# Patient Record
Sex: Female | Born: 1970 | Race: Black or African American | Hispanic: No | Marital: Single
Health system: Southern US, Community
[De-identification: ages and names within clinical notes are randomized; demographics above are authoritative.]

## PROBLEM LIST (undated history)

## (undated) DIAGNOSIS — I639 Cerebral infarction, unspecified: Secondary | ICD-10-CM

---

## 1999-10-25 ENCOUNTER — Encounter: Payer: Self-pay | Admitting: *Deleted

## 1999-10-25 ENCOUNTER — Encounter: Payer: Self-pay | Admitting: Surgery

## 1999-10-25 ENCOUNTER — Inpatient Hospital Stay (HOSPITAL_COMMUNITY): Admission: EM | Admit: 1999-10-25 | Discharge: 1999-10-27 | Payer: Self-pay

## 2001-06-02 ENCOUNTER — Emergency Department (HOSPITAL_COMMUNITY): Admission: EM | Admit: 2001-06-02 | Discharge: 2001-06-02 | Payer: Self-pay | Admitting: Emergency Medicine

## 2005-02-23 ENCOUNTER — Emergency Department (HOSPITAL_COMMUNITY): Admission: EM | Admit: 2005-02-23 | Discharge: 2005-02-23 | Payer: Self-pay | Admitting: Family Medicine

## 2005-03-17 ENCOUNTER — Emergency Department (HOSPITAL_COMMUNITY): Admission: EM | Admit: 2005-03-17 | Discharge: 2005-03-17 | Payer: Self-pay | Admitting: Family Medicine

## 2005-06-03 ENCOUNTER — Emergency Department (HOSPITAL_COMMUNITY): Admission: EM | Admit: 2005-06-03 | Discharge: 2005-06-03 | Payer: Self-pay | Admitting: Emergency Medicine

## 2006-06-22 ENCOUNTER — Emergency Department (HOSPITAL_COMMUNITY): Admission: EM | Admit: 2006-06-22 | Discharge: 2006-06-22 | Payer: Self-pay | Admitting: Family Medicine

## 2007-01-03 ENCOUNTER — Emergency Department (HOSPITAL_COMMUNITY): Admission: EM | Admit: 2007-01-03 | Discharge: 2007-01-03 | Payer: Self-pay | Admitting: Family Medicine

## 2011-06-01 ENCOUNTER — Emergency Department (HOSPITAL_COMMUNITY): Payer: Self-pay

## 2011-06-01 ENCOUNTER — Inpatient Hospital Stay (HOSPITAL_COMMUNITY)
Admission: EM | Admit: 2011-06-01 | Discharge: 2011-06-19 | DRG: 870 | Disposition: A | Discharge status: Home Health | Payer: Self-pay | Attending: Internal Medicine | Admitting: Internal Medicine

## 2011-06-01 DIAGNOSIS — N186 End stage renal disease: Secondary | ICD-10-CM | POA: Diagnosis present

## 2011-06-01 DIAGNOSIS — G92 Toxic encephalopathy: Secondary | ICD-10-CM | POA: Diagnosis present

## 2011-06-01 DIAGNOSIS — K661 Hemoperitoneum: Secondary | ICD-10-CM | POA: Diagnosis present

## 2011-06-01 DIAGNOSIS — S36899A Unspecified injury of other intra-abdominal organs, initial encounter: Secondary | ICD-10-CM | POA: Diagnosis present

## 2011-06-01 DIAGNOSIS — R0682 Tachypnea, not elsewhere classified: Secondary | ICD-10-CM | POA: Diagnosis present

## 2011-06-01 DIAGNOSIS — R7402 Elevation of levels of lactic acid dehydrogenase (LDH): Secondary | ICD-10-CM | POA: Diagnosis present

## 2011-06-01 DIAGNOSIS — F17200 Nicotine dependence, unspecified, uncomplicated: Secondary | ICD-10-CM | POA: Diagnosis present

## 2011-06-01 DIAGNOSIS — G929 Unspecified toxic encephalopathy: Secondary | ICD-10-CM | POA: Diagnosis present

## 2011-06-01 DIAGNOSIS — N179 Acute kidney failure, unspecified: Secondary | ICD-10-CM | POA: Diagnosis present

## 2011-06-01 DIAGNOSIS — E872 Acidosis, unspecified: Secondary | ICD-10-CM | POA: Diagnosis present

## 2011-06-01 DIAGNOSIS — A419 Sepsis, unspecified organism: Principal | ICD-10-CM | POA: Diagnosis present

## 2011-06-01 DIAGNOSIS — E876 Hypokalemia: Secondary | ICD-10-CM | POA: Diagnosis present

## 2011-06-01 DIAGNOSIS — R7401 Elevation of levels of liver transaminase levels: Secondary | ICD-10-CM | POA: Diagnosis present

## 2011-06-01 DIAGNOSIS — E871 Hypo-osmolality and hyponatremia: Secondary | ICD-10-CM | POA: Diagnosis present

## 2011-06-01 DIAGNOSIS — D62 Acute posthemorrhagic anemia: Secondary | ICD-10-CM | POA: Diagnosis present

## 2011-06-01 DIAGNOSIS — E440 Moderate protein-calorie malnutrition: Secondary | ICD-10-CM | POA: Diagnosis present

## 2011-06-01 DIAGNOSIS — K859 Acute pancreatitis without necrosis or infection, unspecified: Secondary | ICD-10-CM | POA: Diagnosis present

## 2011-06-01 DIAGNOSIS — A481 Legionnaires' disease: Secondary | ICD-10-CM | POA: Diagnosis present

## 2011-06-01 DIAGNOSIS — J9600 Acute respiratory failure, unspecified whether with hypoxia or hypercapnia: Secondary | ICD-10-CM | POA: Diagnosis not present

## 2011-06-01 DIAGNOSIS — R197 Diarrhea, unspecified: Secondary | ICD-10-CM | POA: Diagnosis present

## 2011-06-01 DIAGNOSIS — D631 Anemia in chronic kidney disease: Secondary | ICD-10-CM | POA: Diagnosis present

## 2011-06-01 LAB — HEPATIC FUNCTION PANEL
Bilirubin, Direct: 0.2 mg/dL (ref 0.0–0.3)
Indirect Bilirubin: 0.6 mg/dL (ref 0.3–0.9)

## 2011-06-01 LAB — AMMONIA: Ammonia: 23 umol/L (ref 11–60)

## 2011-06-01 LAB — PROCALCITONIN: Procalcitonin: 11.01 ng/mL

## 2011-06-01 LAB — DIFFERENTIAL
Basophils Absolute: 0 10*3/uL (ref 0.0–0.1)
Lymphocytes Relative: 5 % — ABNORMAL LOW (ref 12–46)
Lymphs Abs: 0.6 10*3/uL — ABNORMAL LOW (ref 0.7–4.0)
Monocytes Relative: 3 % (ref 3–12)
Neutrophils Relative %: 92 % — ABNORMAL HIGH (ref 43–77)

## 2011-06-01 LAB — CBC
Hemoglobin: 14.3 g/dL (ref 12.0–15.0)
MCHC: 38.5 g/dL — ABNORMAL HIGH (ref 30.0–36.0)
RBC: 3.95 MIL/uL (ref 3.87–5.11)
WBC: 12.7 10*3/uL — ABNORMAL HIGH (ref 4.0–10.5)

## 2011-06-01 LAB — PROTIME-INR: Prothrombin Time: 14.1 seconds (ref 11.6–15.2)

## 2011-06-01 LAB — LACTIC ACID, PLASMA: Lactic Acid, Venous: 1.3 mmol/L (ref 0.5–2.2)

## 2011-06-02 ENCOUNTER — Inpatient Hospital Stay (HOSPITAL_COMMUNITY): Payer: Self-pay

## 2011-06-02 ENCOUNTER — Other Ambulatory Visit (HOSPITAL_COMMUNITY): Payer: Self-pay

## 2011-06-02 ENCOUNTER — Inpatient Hospital Stay (HOSPITAL_COMMUNITY): Payer: Self-pay | Attending: Internal Medicine

## 2011-06-02 DIAGNOSIS — N179 Acute kidney failure, unspecified: Secondary | ICD-10-CM

## 2011-06-02 DIAGNOSIS — J9600 Acute respiratory failure, unspecified whether with hypoxia or hypercapnia: Secondary | ICD-10-CM

## 2011-06-02 DIAGNOSIS — A419 Sepsis, unspecified organism: Secondary | ICD-10-CM

## 2011-06-02 DIAGNOSIS — J189 Pneumonia, unspecified organism: Secondary | ICD-10-CM

## 2011-06-02 DIAGNOSIS — R0602 Shortness of breath: Secondary | ICD-10-CM

## 2011-06-02 LAB — RENAL FUNCTION PANEL
Albumin: 1.8 g/dL — ABNORMAL LOW (ref 3.5–5.2)
GFR calc Af Amer: 7 mL/min — ABNORMAL LOW (ref >60)
GFR calc non Af Amer: 6 mL/min — ABNORMAL LOW (ref >60)
Phosphorus: 6.8 mg/dL — ABNORMAL HIGH (ref 2.3–4.6)
Potassium: 3.7 mEq/L (ref 3.5–5.1)
Sodium: 130 mEq/L — ABNORMAL LOW (ref 135–145)

## 2011-06-02 LAB — BLOOD GAS, ARTERIAL
Acid-base deficit: 9.4 mmol/L — ABNORMAL HIGH (ref 0.0–2.0)
Bicarbonate: 15.3 mEq/L — ABNORMAL LOW (ref 20.0–24.0)
MECHVT: 440 mL
TCO2: 16.2 mmol/L (ref 0–100)
TCO2: 19.1 mmol/L (ref 0–100)
pCO2 arterial: 32.6 mmHg — ABNORMAL LOW (ref 35.0–45.0)
pCO2 arterial: 36.3 mmHg (ref 35.0–45.0)
pH, Arterial: 7.305 — ABNORMAL LOW (ref 7.350–7.400)
pH, Arterial: 7.332 — ABNORMAL LOW (ref 7.350–7.400)
pO2, Arterial: 107 mmHg — ABNORMAL HIGH (ref 80.0–100.0)

## 2011-06-02 LAB — POCT I-STAT 3, ART BLOOD GAS (G3+)
Acid-base deficit: 1 mmol/L (ref 0.0–2.0)
Acid-base deficit: 11 mmol/L — ABNORMAL HIGH (ref 0.0–2.0)
Acid-base deficit: 10 mmol/L — ABNORMAL HIGH (ref 0.0–2.0)
Bicarbonate: 19.8 mEq/L — ABNORMAL LOW (ref 20.0–24.0)
Bicarbonate: 17.3 mEq/L — ABNORMAL LOW (ref 20.0–24.0)
Bicarbonate: 15.3 mEq/L — ABNORMAL LOW (ref 20.0–24.0)
Bicarbonate: 17.9 mEq/L — ABNORMAL LOW (ref 20.0–24.0)
O2 Saturation: 100 %
O2 Saturation: 94 %
Patient temperature: 37
Patient temperature: 98.6
Patient temperature: 98.7
TCO2: 20 mmol/L (ref 0–100)
TCO2: 16 mmol/L (ref 0–100)
TCO2: 19 mmol/L (ref 0–100)
pCO2 arterial: 24.4 mmHg — ABNORMAL LOW (ref 35.0–45.0)
pCO2 arterial: 33.7 mmHg — ABNORMAL LOW (ref 35.0–45.0)
pCO2 arterial: 31.8 mmHg — ABNORMAL LOW (ref 35.0–45.0)
pH, Arterial: 7.517 — ABNORMAL HIGH (ref 7.350–7.400)
pH, Arterial: 7.412 — ABNORMAL HIGH (ref 7.350–7.400)
pH, Arterial: 7.254 — ABNORMAL LOW (ref 7.350–7.400)
pH, Arterial: 7.288 — ABNORMAL LOW (ref 7.350–7.400)
pO2, Arterial: 396 mmHg — ABNORMAL HIGH (ref 80.0–100.0)
pO2, Arterial: 381 mmHg — ABNORMAL HIGH (ref 80.0–100.0)
pO2, Arterial: 136 mmHg — ABNORMAL HIGH (ref 80.0–100.0)

## 2011-06-02 LAB — COMPREHENSIVE METABOLIC PANEL
AST: 220 U/L — ABNORMAL HIGH (ref 0–37)
Albumin: 1.9 g/dL — ABNORMAL LOW (ref 3.5–5.2)
Alkaline Phosphatase: 107 U/L (ref 39–117)
CO2: 16 mEq/L — ABNORMAL LOW (ref 19–32)
Chloride: 101 mEq/L (ref 96–112)
GFR calc non Af Amer: 6 mL/min — ABNORMAL LOW (ref >60)
Potassium: 3.9 mEq/L (ref 3.5–5.1)
Total Bilirubin: 0.9 mg/dL (ref 0.3–1.2)

## 2011-06-02 LAB — URINALYSIS, ROUTINE W REFLEX MICROSCOPIC
Glucose, UA: 100 mg/dL — AB
Leukocytes, UA: NEGATIVE
Nitrite: NEGATIVE
Specific Gravity, Urine: 1.022 (ref 1.005–1.030)
pH: 5.5 (ref 5.0–8.0)

## 2011-06-02 LAB — URINE MICROSCOPIC-ADD ON

## 2011-06-02 LAB — CARBOXYHEMOGLOBIN
Carboxyhemoglobin: 0.7 % (ref 0.5–1.5)
O2 Saturation: 91.5 %

## 2011-06-02 LAB — DIFFERENTIAL
Basophils Absolute: 0 10*3/uL (ref 0.0–0.1)
Lymphocytes Relative: 8 % — ABNORMAL LOW (ref 12–46)
Neutro Abs: 7.8 10*3/uL — ABNORMAL HIGH (ref 1.7–7.7)
Neutrophils Relative %: 88 % — ABNORMAL HIGH (ref 43–77)

## 2011-06-02 LAB — D-DIMER, QUANTITATIVE: D-Dimer, Quant: 12.64 ug/mL-FEU — ABNORMAL HIGH (ref 0.00–0.48)

## 2011-06-02 LAB — BASIC METABOLIC PANEL
BUN: 40 mg/dL — ABNORMAL HIGH (ref 6–23)
Chloride: 94 mEq/L — ABNORMAL LOW (ref 96–112)
Chloride: 97 mEq/L (ref 96–112)
GFR calc Af Amer: 9 mL/min — ABNORMAL LOW (ref >60)
GFR calc Af Amer: 9 mL/min — ABNORMAL LOW (ref >60)
Glucose, Bld: 90 mg/dL (ref 70–99)
Potassium: 2.6 mEq/L — CL (ref 3.5–5.1)
Potassium: 2.9 mEq/L — ABNORMAL LOW (ref 3.5–5.1)

## 2011-06-02 LAB — ABO/RH: ABO/RH(D): O POS

## 2011-06-02 LAB — CREATININE, URINE, RANDOM: Creatinine, Urine: 173.06 mg/dL

## 2011-06-02 LAB — CARDIAC PANEL(CRET KIN+CKTOT+MB+TROPI)
Relative Index: 0.5 (ref 0.0–2.5)
Troponin I: <0.3 ng/mL (ref <0.30)

## 2011-06-02 LAB — PHOSPHORUS: Phosphorus: 5.9 mg/dL — ABNORMAL HIGH (ref 2.3–4.6)

## 2011-06-02 LAB — GLUCOSE, CAPILLARY: Glucose-Capillary: 131 mg/dL — ABNORMAL HIGH (ref 70–99)

## 2011-06-02 LAB — POCT ACTIVATED CLOTTING TIME
Activated Clotting Time: 144 seconds
Activated Clotting Time: 149 seconds

## 2011-06-02 LAB — POCT I-STAT, CHEM 8
Calcium, Ion: 0.91 mmol/L — ABNORMAL LOW (ref 1.12–1.32)
Glucose, Bld: 138 mg/dL — ABNORMAL HIGH (ref 70–99)
HCT: 43 % (ref 36.0–46.0)
Hemoglobin: 14.6 g/dL (ref 12.0–15.0)
TCO2: 18 mmol/L (ref 0–100)

## 2011-06-02 LAB — PROTIME-INR
INR: 1.18 (ref 0.00–1.49)
Prothrombin Time: 15.3 seconds — ABNORMAL HIGH (ref 11.6–15.2)

## 2011-06-02 LAB — CBC
HCT: 31.5 % — ABNORMAL LOW (ref 36.0–46.0)
Hemoglobin: 11.7 g/dL — ABNORMAL LOW (ref 12.0–15.0)
WBC: 8.9 10*3/uL (ref 4.0–10.5)

## 2011-06-02 LAB — LACTIC ACID, PLASMA: Lactic Acid, Venous: 0.9 mmol/L (ref 0.5–2.2)

## 2011-06-02 LAB — STREP PNEUMONIAE URINARY ANTIGEN: Strep Pneumo Urinary Antigen: NEGATIVE

## 2011-06-02 LAB — CORTISOL: Cortisol, Plasma: 30.6 ug/dL

## 2011-06-02 LAB — APTT: aPTT: 37 seconds (ref 24–37)

## 2011-06-02 LAB — TYPE AND SCREEN

## 2011-06-02 LAB — PRO B NATRIURETIC PEPTIDE: Pro B Natriuretic peptide (BNP): 1501 pg/mL — ABNORMAL HIGH (ref 0–125)

## 2011-06-02 LAB — MRSA PCR SCREENING: MRSA by PCR: NEGATIVE

## 2011-06-03 ENCOUNTER — Inpatient Hospital Stay (HOSPITAL_COMMUNITY): Payer: Self-pay

## 2011-06-03 LAB — BLOOD GAS, ARTERIAL
Acid-base deficit: 2 mmol/L (ref 0.0–2.0)
Bicarbonate: 23.8 mEq/L (ref 20.0–24.0)
FIO2: 0.5 %
MECHVT: 440 mL
O2 Saturation: 99.4 %
PEEP: 5 cmH2O
Patient temperature: 98.4
Patient temperature: 98.6
RATE: 20 resp/min
TCO2: 25.7 mmol/L (ref 0–100)
pCO2 arterial: 39.9 mmHg (ref 35.0–45.0)
pH, Arterial: 7.206 — ABNORMAL LOW (ref 7.350–7.400)
pO2, Arterial: 59.8 mmHg — ABNORMAL LOW (ref 80.0–100.0)

## 2011-06-03 LAB — POCT I-STAT 3, ART BLOOD GAS (G3+)
Acid-base deficit: 2 mmol/L (ref 0.0–2.0)
Bicarbonate: 25.1 mEq/L — ABNORMAL HIGH (ref 20.0–24.0)
pCO2 arterial: 54.8 mmHg — ABNORMAL HIGH (ref 35.0–45.0)
pO2, Arterial: 102 mmHg — ABNORMAL HIGH (ref 80.0–100.0)

## 2011-06-03 LAB — AMYLASE: Amylase: 200 U/L — ABNORMAL HIGH (ref 0–105)

## 2011-06-03 LAB — CBC
MCH: 34.5 pg — ABNORMAL HIGH (ref 26.0–34.0)
MCHC: 36.6 g/dL — ABNORMAL HIGH (ref 30.0–36.0)
Platelets: 246 10*3/uL (ref 150–400)
RBC: 3.13 MIL/uL — ABNORMAL LOW (ref 3.87–5.11)
RDW: 14.2 % (ref 11.5–15.5)

## 2011-06-03 LAB — URINE CULTURE
Colony Count: NO GROWTH
Culture  Setup Time: 201207241306
Culture: NO GROWTH

## 2011-06-03 LAB — COMPREHENSIVE METABOLIC PANEL
Albumin: 1.9 g/dL — ABNORMAL LOW (ref 3.5–5.2)
BUN: 27 mg/dL — ABNORMAL HIGH (ref 6–23)
Calcium: 8.4 mg/dL (ref 8.4–10.5)
Creatinine, Ser: 4.13 mg/dL — ABNORMAL HIGH (ref 0.50–1.10)
GFR calc Af Amer: 14 mL/min — ABNORMAL LOW (ref >60)
Glucose, Bld: 140 mg/dL — ABNORMAL HIGH (ref 70–99)
Total Protein: 6.2 g/dL (ref 6.0–8.3)

## 2011-06-03 LAB — POCT ACTIVATED CLOTTING TIME
Activated Clotting Time: 154 seconds
Activated Clotting Time: 166 seconds
Activated Clotting Time: 182 seconds
Activated Clotting Time: 177 seconds
Activated Clotting Time: 182 seconds
Activated Clotting Time: 193 seconds

## 2011-06-03 LAB — RENAL FUNCTION PANEL
BUN: 19 mg/dL (ref 6–23)
CO2: 24 mEq/L (ref 19–32)
Chloride: 101 mEq/L (ref 96–112)
GFR calc Af Amer: 21 mL/min — ABNORMAL LOW (ref >60)
Glucose, Bld: 94 mg/dL (ref 70–99)
Phosphorus: 2.9 mg/dL (ref 2.3–4.6)
Potassium: 4.4 mEq/L (ref 3.5–5.1)
Sodium: 136 mEq/L (ref 135–145)

## 2011-06-03 LAB — GLUCOSE, CAPILLARY
Glucose-Capillary: 116 mg/dL — ABNORMAL HIGH (ref 70–99)
Glucose-Capillary: 185 mg/dL — ABNORMAL HIGH (ref 70–99)
Glucose-Capillary: 93 mg/dL (ref 70–99)

## 2011-06-03 LAB — HEPATITIS B SURFACE ANTIBODY,QUALITATIVE: Hep B S Ab: NEGATIVE

## 2011-06-03 LAB — DRUGS OF ABUSE SCREEN W/O ALC, ROUTINE URINE
Amphetamine Screen, Ur: NEGATIVE
Barbiturate Quant, Ur: NEGATIVE
Creatinine,U: 179.1 mg/dL
Marijuana Metabolite: POSITIVE — AB
Methadone: NEGATIVE

## 2011-06-03 LAB — APTT: aPTT: 67 seconds — ABNORMAL HIGH (ref 24–37)

## 2011-06-03 LAB — LIPASE, BLOOD: Lipase: 221 U/L — ABNORMAL HIGH (ref 11–59)

## 2011-06-03 LAB — ANA: Anti Nuclear Antibody(ANA): NEGATIVE

## 2011-06-03 LAB — C3 COMPLEMENT: C3 Complement: 157 mg/dL (ref 90–180)

## 2011-06-03 LAB — HEPATITIS PANEL, ACUTE
HCV Ab: NEGATIVE
Hep A IgM: NEGATIVE

## 2011-06-03 LAB — HEPATITIS C ANTIBODY: HCV Ab: NEGATIVE

## 2011-06-03 LAB — C4 COMPLEMENT: Complement C4, Body Fluid: 39 mg/dL (ref 10–40)

## 2011-06-04 ENCOUNTER — Inpatient Hospital Stay (HOSPITAL_COMMUNITY): Payer: Self-pay

## 2011-06-04 LAB — RENAL FUNCTION PANEL
BUN: 15 mg/dL (ref 6–23)
CO2: 25 mEq/L (ref 19–32)
CO2: 28 mEq/L (ref 19–32)
Calcium: 9 mg/dL (ref 8.4–10.5)
Chloride: 100 mEq/L (ref 96–112)
Creatinine, Ser: 2.16 mg/dL — ABNORMAL HIGH (ref 0.50–1.10)
GFR calc Af Amer: 31 mL/min — ABNORMAL LOW (ref >60)
GFR calc non Af Amer: 26 mL/min — ABNORMAL LOW (ref >60)
GFR calc non Af Amer: 25 mL/min — ABNORMAL LOW (ref >60)
Glucose, Bld: 127 mg/dL — ABNORMAL HIGH (ref 70–99)
Phosphorus: 2 mg/dL — ABNORMAL LOW (ref 2.3–4.6)
Potassium: 3.6 mEq/L (ref 3.5–5.1)
Potassium: 3.8 mEq/L (ref 3.5–5.1)
Sodium: 137 mEq/L (ref 135–145)

## 2011-06-04 LAB — CULTURE, BAL-QUANTITATIVE W GRAM STAIN
Colony Count: NO GROWTH
Culture: NO GROWTH

## 2011-06-04 LAB — POCT I-STAT 3, ART BLOOD GAS (G3+)
Acid-Base Excess: 2 mmol/L (ref 0.0–2.0)
O2 Saturation: 97 %
Patient temperature: 97.9

## 2011-06-04 LAB — GLUCOSE, CAPILLARY
Glucose-Capillary: 102 mg/dL — ABNORMAL HIGH (ref 70–99)
Glucose-Capillary: 152 mg/dL — ABNORMAL HIGH (ref 70–99)
Glucose-Capillary: 104 mg/dL — ABNORMAL HIGH (ref 70–99)
Glucose-Capillary: 88 mg/dL (ref 70–99)

## 2011-06-04 LAB — LEGIONELLA ANTIGEN, URINE

## 2011-06-04 LAB — CBC
MCH: 34.9 pg — ABNORMAL HIGH (ref 26.0–34.0)
MCHC: 37 g/dL — ABNORMAL HIGH (ref 30.0–36.0)
MCV: 94.1 fL (ref 78.0–100.0)
Platelets: 301 10*3/uL (ref 150–400)
RBC: 3.07 MIL/uL — ABNORMAL LOW (ref 3.87–5.11)

## 2011-06-04 LAB — POCT ACTIVATED CLOTTING TIME
Activated Clotting Time: 182 seconds
Activated Clotting Time: 193 seconds
Activated Clotting Time: 199 seconds

## 2011-06-04 LAB — GLOMERULAR BASEMENT MEMBRANE ANTIBODIES

## 2011-06-04 LAB — MAGNESIUM: Magnesium: 2.9 mg/dL — ABNORMAL HIGH (ref 1.5–2.5)

## 2011-06-04 LAB — APTT: aPTT: 145 seconds — ABNORMAL HIGH (ref 24–37)

## 2011-06-05 ENCOUNTER — Inpatient Hospital Stay (HOSPITAL_COMMUNITY): Payer: Self-pay

## 2011-06-05 LAB — BLOOD GAS, ARTERIAL
Acid-Base Excess: 3.5 mmol/L — ABNORMAL HIGH (ref 0.0–2.0)
Bicarbonate: 27.6 mEq/L — ABNORMAL HIGH (ref 20.0–24.0)
FIO2: 0.4 %
O2 Saturation: 97.7 %
PEEP: 5 cmH2O
Patient temperature: 96.5
TCO2: 28.9 mmol/L (ref 0–100)
pO2, Arterial: 85.2 mmHg (ref 80.0–100.0)

## 2011-06-05 LAB — BENZODIAZEPINE, QUANTITATIVE, URINE
Alprazolam (GC/LC/MS), ur confirm: NEGATIVE NG/ML
Flurazepam GC/MS Conf: NEGATIVE NG/ML
Oxazepam GC/MS Conf: NEGATIVE NG/ML
Temazepam GC/MS Conf: NEGATIVE NG/ML

## 2011-06-05 LAB — RENAL FUNCTION PANEL
BUN: 12 mg/dL (ref 6–23)
Calcium: 9.4 mg/dL (ref 8.4–10.5)
Chloride: 101 mEq/L (ref 96–112)
GFR calc Af Amer: 41 mL/min — ABNORMAL LOW (ref >60)
GFR calc Af Amer: 42 mL/min — ABNORMAL LOW (ref >60)
Glucose, Bld: 102 mg/dL — ABNORMAL HIGH (ref 70–99)
Glucose, Bld: 131 mg/dL — ABNORMAL HIGH (ref 70–99)
Phosphorus: 1.4 mg/dL — ABNORMAL LOW (ref 2.3–4.6)
Phosphorus: 3.5 mg/dL (ref 2.3–4.6)
Potassium: 3.3 mEq/L — ABNORMAL LOW (ref 3.5–5.1)
Sodium: 138 mEq/L (ref 135–145)
Sodium: 138 mEq/L (ref 135–145)

## 2011-06-05 LAB — CBC
HCT: 29.9 % — ABNORMAL LOW (ref 36.0–46.0)
Hemoglobin: 10.7 g/dL — ABNORMAL LOW (ref 12.0–15.0)
MCH: 34 pg (ref 26.0–34.0)
MCHC: 35.8 g/dL (ref 30.0–36.0)
MCV: 94.9 fL (ref 78.0–100.0)
RDW: 14.8 % (ref 11.5–15.5)

## 2011-06-05 LAB — POCT ACTIVATED CLOTTING TIME
Activated Clotting Time: 204 seconds
Activated Clotting Time: 188 seconds

## 2011-06-05 LAB — MAGNESIUM: Magnesium: 3.3 mg/dL — ABNORMAL HIGH (ref 1.5–2.5)

## 2011-06-05 LAB — GLUCOSE, CAPILLARY
Glucose-Capillary: 84 mg/dL (ref 70–99)
Glucose-Capillary: 141 mg/dL — ABNORMAL HIGH (ref 70–99)

## 2011-06-06 ENCOUNTER — Inpatient Hospital Stay (HOSPITAL_COMMUNITY): Payer: Self-pay

## 2011-06-06 DIAGNOSIS — I120 Hypertensive chronic kidney disease with stage 5 chronic kidney disease or end stage renal disease: Secondary | ICD-10-CM

## 2011-06-06 DIAGNOSIS — N186 End stage renal disease: Secondary | ICD-10-CM

## 2011-06-06 LAB — GLUCOSE, CAPILLARY: Glucose-Capillary: 109 mg/dL — ABNORMAL HIGH (ref 70–99)

## 2011-06-06 LAB — CBC
HCT: 32.9 % — ABNORMAL LOW (ref 36.0–46.0)
MCH: 35.5 pg — ABNORMAL HIGH (ref 26.0–34.0)
MCHC: 35.6 g/dL (ref 30.0–36.0)
MCHC: 36.7 g/dL — ABNORMAL HIGH (ref 30.0–36.0)
MCV: 97.1 fL (ref 78.0–100.0)
Platelets: 230 10*3/uL (ref 150–400)
RDW: 15.3 % (ref 11.5–15.5)
RDW: 15.5 % (ref 11.5–15.5)

## 2011-06-06 LAB — POCT ACTIVATED CLOTTING TIME
Activated Clotting Time: 199 seconds
Activated Clotting Time: 160 seconds
Activated Clotting Time: 204 seconds
Activated Clotting Time: 199 seconds

## 2011-06-06 LAB — RENAL FUNCTION PANEL
Albumin: 2.5 g/dL — ABNORMAL LOW (ref 3.5–5.2)
Albumin: 2.5 g/dL — ABNORMAL LOW (ref 3.5–5.2)
Calcium: 9.9 mg/dL (ref 8.4–10.5)
Chloride: 99 mEq/L (ref 96–112)
Creatinine, Ser: 1.69 mg/dL — ABNORMAL HIGH (ref 0.50–1.10)
Creatinine, Ser: 1.64 mg/dL — ABNORMAL HIGH (ref 0.50–1.10)
GFR calc non Af Amer: 33 mL/min — ABNORMAL LOW (ref >60)
GFR calc non Af Amer: 34 mL/min — ABNORMAL LOW (ref >60)
Phosphorus: 2.5 mg/dL (ref 2.3–4.6)
Potassium: 4 mEq/L (ref 3.5–5.1)

## 2011-06-06 LAB — APTT: aPTT: 121 seconds — ABNORMAL HIGH (ref 24–37)
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
## 2011-06-07 ENCOUNTER — Inpatient Hospital Stay (HOSPITAL_COMMUNITY): Payer: Self-pay

## 2011-06-07 DIAGNOSIS — K859 Acute pancreatitis without necrosis or infection, unspecified: Secondary | ICD-10-CM

## 2011-06-07 LAB — RENAL FUNCTION PANEL
BUN: 19 mg/dL (ref 6–23)
CO2: 24 mEq/L (ref 19–32)
CO2: 25 mEq/L (ref 19–32)
Calcium: 11.1 mg/dL — ABNORMAL HIGH (ref 8.4–10.5)
Chloride: 99 mEq/L (ref 96–112)
Creatinine, Ser: 1.78 mg/dL — ABNORMAL HIGH (ref 0.50–1.10)
Creatinine, Ser: 1.9 mg/dL — ABNORMAL HIGH (ref 0.50–1.10)
GFR calc Af Amer: 35 mL/min — ABNORMAL LOW (ref >60)
GFR calc non Af Amer: 31 mL/min — ABNORMAL LOW (ref >60)
GFR calc non Af Amer: 29 mL/min — ABNORMAL LOW (ref >60)
Potassium: 4.2 mEq/L (ref 3.5–5.1)
Sodium: 136 mEq/L (ref 135–145)

## 2011-06-07 LAB — CBC
Hemoglobin: 12.2 g/dL (ref 12.0–15.0)
MCH: 34.9 pg — ABNORMAL HIGH (ref 26.0–34.0)
MCHC: 35.4 g/dL (ref 30.0–36.0)
Platelets: 423 10*3/uL — ABNORMAL HIGH (ref 150–400)

## 2011-06-07 LAB — CULTURE, BLOOD (ROUTINE X 2)
Culture  Setup Time: 201207232203
Culture: NO GROWTH
Culture: NO GROWTH

## 2011-06-07 LAB — GLUCOSE, CAPILLARY
Glucose-Capillary: 205 mg/dL — ABNORMAL HIGH (ref 70–99)
Glucose-Capillary: 174 mg/dL — ABNORMAL HIGH (ref 70–99)
Glucose-Capillary: 170 mg/dL — ABNORMAL HIGH (ref 70–99)
Glucose-Capillary: 109 mg/dL — ABNORMAL HIGH (ref 70–99)

## 2011-06-07 LAB — HEPATIC FUNCTION PANEL
ALT: 189 U/L — ABNORMAL HIGH (ref 0–35)
Albumin: 3.2 g/dL — ABNORMAL LOW (ref 3.5–5.2)
Alkaline Phosphatase: 649 U/L — ABNORMAL HIGH (ref 39–117)
Total Bilirubin: 0.4 mg/dL (ref 0.3–1.2)

## 2011-06-07 LAB — APTT: aPTT: 121 seconds — ABNORMAL HIGH (ref 24–37)

## 2011-06-07 LAB — POCT ACTIVATED CLOTTING TIME
Activated Clotting Time: 188 seconds
Activated Clotting Time: 204 seconds

## 2011-06-07 LAB — AMYLASE: Amylase: 693 U/L — ABNORMAL HIGH (ref 0–105)

## 2011-06-08 ENCOUNTER — Inpatient Hospital Stay (HOSPITAL_COMMUNITY): Payer: Self-pay

## 2011-06-08 ENCOUNTER — Other Ambulatory Visit (HOSPITAL_COMMUNITY): Payer: Self-pay

## 2011-06-08 LAB — GLUCOSE, CAPILLARY
Glucose-Capillary: 119 mg/dL — ABNORMAL HIGH (ref 70–99)
Glucose-Capillary: 135 mg/dL — ABNORMAL HIGH (ref 70–99)
Glucose-Capillary: 121 mg/dL — ABNORMAL HIGH (ref 70–99)

## 2011-06-08 LAB — CBC
HCT: 33.5 % — ABNORMAL LOW (ref 36.0–46.0)
Hemoglobin: 12.3 g/dL (ref 12.0–15.0)
MCH: 35.5 pg — ABNORMAL HIGH (ref 26.0–34.0)
MCHC: 36.7 g/dL — ABNORMAL HIGH (ref 30.0–36.0)
MCV: 96.8 fL (ref 78.0–100.0)
RBC: 3.46 MIL/uL — ABNORMAL LOW (ref 3.87–5.11)

## 2011-06-08 LAB — POCT ACTIVATED CLOTTING TIME: Activated Clotting Time: 215 seconds

## 2011-06-08 LAB — COMPREHENSIVE METABOLIC PANEL
ALT: 221 U/L — ABNORMAL HIGH (ref 0–35)
AST: 312 U/L — ABNORMAL HIGH (ref 0–37)
CO2: 23 mEq/L (ref 19–32)
Calcium: 10 mg/dL (ref 8.4–10.5)
Sodium: 132 mEq/L — ABNORMAL LOW (ref 135–145)
Total Protein: 8.7 g/dL — ABNORMAL HIGH (ref 6.0–8.3)

## 2011-06-08 LAB — PHOSPHORUS: Phosphorus: 2.4 mg/dL (ref 2.3–4.6)

## 2011-06-08 LAB — CK: Total CK: 610 U/L — ABNORMAL HIGH (ref 7–177)

## 2011-06-08 LAB — LIPASE, BLOOD: Lipase: 2224 U/L — ABNORMAL HIGH (ref 11–59)

## 2011-06-08 LAB — APTT: aPTT: 147 seconds — ABNORMAL HIGH (ref 24–37)

## 2011-06-09 ENCOUNTER — Inpatient Hospital Stay (HOSPITAL_COMMUNITY): Payer: Self-pay

## 2011-06-09 LAB — MAGNESIUM: Magnesium: 3.1 mg/dL — ABNORMAL HIGH (ref 1.5–2.5)

## 2011-06-09 LAB — COMPREHENSIVE METABOLIC PANEL
AST: 151 U/L — ABNORMAL HIGH (ref 0–37)
Albumin: 2.7 g/dL — ABNORMAL LOW (ref 3.5–5.2)
Chloride: 94 mEq/L — ABNORMAL LOW (ref 96–112)
Creatinine, Ser: 4.66 mg/dL — ABNORMAL HIGH (ref 0.50–1.10)
Potassium: 4.5 mEq/L (ref 3.5–5.1)
Total Bilirubin: 0.4 mg/dL (ref 0.3–1.2)

## 2011-06-09 LAB — CBC
Platelets: 450 10*3/uL — ABNORMAL HIGH (ref 150–400)
RBC: 3 MIL/uL — ABNORMAL LOW (ref 3.87–5.11)
WBC: 31.7 10*3/uL — ABNORMAL HIGH (ref 4.0–10.5)

## 2011-06-09 LAB — GLUCOSE, CAPILLARY
Glucose-Capillary: 113 mg/dL — ABNORMAL HIGH (ref 70–99)
Glucose-Capillary: 103 mg/dL — ABNORMAL HIGH (ref 70–99)
Glucose-Capillary: 102 mg/dL — ABNORMAL HIGH (ref 70–99)

## 2011-06-09 LAB — APTT: aPTT: 39 seconds — ABNORMAL HIGH (ref 24–37)

## 2011-06-10 ENCOUNTER — Inpatient Hospital Stay (HOSPITAL_COMMUNITY): Payer: Self-pay

## 2011-06-10 DIAGNOSIS — N170 Acute kidney failure with tubular necrosis: Secondary | ICD-10-CM

## 2011-06-10 DIAGNOSIS — N186 End stage renal disease: Secondary | ICD-10-CM

## 2011-06-10 DIAGNOSIS — J9600 Acute respiratory failure, unspecified whether with hypoxia or hypercapnia: Secondary | ICD-10-CM

## 2011-06-10 DIAGNOSIS — I120 Hypertensive chronic kidney disease with stage 5 chronic kidney disease or end stage renal disease: Secondary | ICD-10-CM

## 2011-06-10 DIAGNOSIS — A481 Legionnaires' disease: Secondary | ICD-10-CM

## 2011-06-10 DIAGNOSIS — K859 Acute pancreatitis without necrosis or infection, unspecified: Secondary | ICD-10-CM

## 2011-06-10 LAB — CULTURE, BLOOD (ROUTINE X 2)
Culture  Setup Time: 201207260348
Culture: NO GROWTH

## 2011-06-10 LAB — CBC
MCH: 34.4 pg — ABNORMAL HIGH (ref 26.0–34.0)
MCH: 33.9 pg (ref 26.0–34.0)
MCHC: 36.8 g/dL — ABNORMAL HIGH (ref 30.0–36.0)
MCHC: 36 g/dL (ref 30.0–36.0)
MCV: 93.6 fL (ref 78.0–100.0)
MCV: 94.3 fL (ref 78.0–100.0)
Platelets: 428 10*3/uL — ABNORMAL HIGH (ref 150–400)
Platelets: 398 10*3/uL (ref 150–400)
Platelets: 421 10*3/uL — ABNORMAL HIGH (ref 150–400)
RBC: 2.27 MIL/uL — ABNORMAL LOW (ref 3.87–5.11)
RBC: 2.11 MIL/uL — ABNORMAL LOW (ref 3.87–5.11)
RDW: 14 % (ref 11.5–15.5)
RDW: 14.2 % (ref 11.5–15.5)
WBC: 29.2 10*3/uL — ABNORMAL HIGH (ref 4.0–10.5)
WBC: 19.9 10*3/uL — ABNORMAL HIGH (ref 4.0–10.5)

## 2011-06-10 LAB — COMPREHENSIVE METABOLIC PANEL
BUN: 81 mg/dL — ABNORMAL HIGH (ref 6–23)
CO2: 18 mEq/L — ABNORMAL LOW (ref 19–32)
Calcium: 9.7 mg/dL (ref 8.4–10.5)
Creatinine, Ser: 6.73 mg/dL — ABNORMAL HIGH (ref 0.50–1.10)
GFR calc Af Amer: 8 mL/min — ABNORMAL LOW (ref >60)
GFR calc non Af Amer: 7 mL/min — ABNORMAL LOW (ref >60)
Glucose, Bld: 90 mg/dL (ref 70–99)
Total Bilirubin: 0.4 mg/dL (ref 0.3–1.2)

## 2011-06-10 LAB — DIFFERENTIAL
Basophils Absolute: 0 10*3/uL (ref 0.0–0.1)
Basophils Absolute: 0 10*3/uL (ref 0.0–0.1)
Eosinophils Absolute: 0.2 10*3/uL (ref 0.0–0.7)
Eosinophils Relative: 0 % (ref 0–5)
Eosinophils Relative: 1 % (ref 0–5)
Lymphocytes Relative: 9 % — ABNORMAL LOW (ref 12–46)
Lymphs Abs: 1.9 10*3/uL (ref 0.7–4.0)
Monocytes Absolute: 1.9 10*3/uL — ABNORMAL HIGH (ref 0.1–1.0)
Monocytes Relative: 8 % (ref 3–12)
Neutro Abs: 16.5 10*3/uL — ABNORMAL HIGH (ref 1.7–7.7)
Neutrophils Relative %: 83 % — ABNORMAL HIGH (ref 43–77)
Smear Review: ADEQUATE

## 2011-06-10 LAB — RENAL FUNCTION PANEL
Albumin: 2.2 g/dL — ABNORMAL LOW (ref 3.5–5.2)
Chloride: 91 mEq/L — ABNORMAL LOW (ref 96–112)
GFR calc Af Amer: 8 mL/min — ABNORMAL LOW (ref >60)
GFR calc non Af Amer: 7 mL/min — ABNORMAL LOW (ref >60)
Phosphorus: 10.2 mg/dL — ABNORMAL HIGH (ref 2.3–4.6)
Potassium: 4.8 mEq/L (ref 3.5–5.1)
Sodium: 126 mEq/L — ABNORMAL LOW (ref 135–145)

## 2011-06-10 LAB — PHOSPHORUS: Phosphorus: 9 mg/dL — ABNORMAL HIGH (ref 2.3–4.6)

## 2011-06-10 LAB — GLUCOSE, CAPILLARY
Glucose-Capillary: 81 mg/dL (ref 70–99)
Glucose-Capillary: 95 mg/dL (ref 70–99)

## 2011-06-10 LAB — MAGNESIUM: Magnesium: 3.2 mg/dL — ABNORMAL HIGH (ref 1.5–2.5)

## 2011-06-10 LAB — IRON AND TIBC
Saturation Ratios: 6 % — ABNORMAL LOW (ref 20–55)
UIBC: 287 ug/dL

## 2011-06-11 ENCOUNTER — Other Ambulatory Visit (HOSPITAL_COMMUNITY): Payer: Self-pay

## 2011-06-11 LAB — AMYLASE: Amylase: 854 U/L — ABNORMAL HIGH (ref 0–105)

## 2011-06-11 LAB — DIFFERENTIAL
Basophils Absolute: 0.1 10*3/uL (ref 0.0–0.1)
Basophils Relative: 0 % (ref 0–1)
Eosinophils Absolute: 0.1 10*3/uL (ref 0.0–0.7)
Neutro Abs: 15.5 10*3/uL — ABNORMAL HIGH (ref 1.7–7.7)
Neutrophils Relative %: 82 % — ABNORMAL HIGH (ref 43–77)

## 2011-06-11 LAB — COMPREHENSIVE METABOLIC PANEL
Alkaline Phosphatase: 332 U/L — ABNORMAL HIGH (ref 39–117)
BUN: 38 mg/dL — ABNORMAL HIGH (ref 6–23)
GFR calc Af Amer: 17 mL/min — ABNORMAL LOW (ref >60)
Glucose, Bld: 95 mg/dL (ref 70–99)
Potassium: 3.8 mEq/L (ref 3.5–5.1)
Total Bilirubin: 0.5 mg/dL (ref 0.3–1.2)
Total Protein: 6.6 g/dL (ref 6.0–8.3)

## 2011-06-11 LAB — CBC
HCT: 20.9 % — ABNORMAL LOW (ref 36.0–46.0)
MCH: 34.1 pg — ABNORMAL HIGH (ref 26.0–34.0)
MCH: 34.4 pg — ABNORMAL HIGH (ref 26.0–34.0)
MCV: 94.6 fL (ref 78.0–100.0)
Platelets: 448 10*3/uL — ABNORMAL HIGH (ref 150–400)
RBC: 2.32 MIL/uL — ABNORMAL LOW (ref 3.87–5.11)
RDW: 14.2 % (ref 11.5–15.5)
WBC: 18.9 10*3/uL — ABNORMAL HIGH (ref 4.0–10.5)
WBC: 19.6 10*3/uL — ABNORMAL HIGH (ref 4.0–10.5)

## 2011-06-11 LAB — GLUCOSE, CAPILLARY: Glucose-Capillary: 101 mg/dL — ABNORMAL HIGH (ref 70–99)

## 2011-06-11 LAB — FERRITIN: Ferritin: 1454 ng/mL — ABNORMAL HIGH (ref 10–291)

## 2011-06-12 ENCOUNTER — Inpatient Hospital Stay (HOSPITAL_COMMUNITY): Payer: Self-pay

## 2011-06-12 LAB — GLUCOSE, CAPILLARY: Glucose-Capillary: 138 mg/dL — ABNORMAL HIGH (ref 70–99)

## 2011-06-12 LAB — COMPREHENSIVE METABOLIC PANEL
ALT: 69 U/L — ABNORMAL HIGH (ref 0–35)
AST: 79 U/L — ABNORMAL HIGH (ref 0–37)
CO2: 21 mEq/L (ref 19–32)
Chloride: 93 mEq/L — ABNORMAL LOW (ref 96–112)
Creatinine, Ser: 4.83 mg/dL — ABNORMAL HIGH (ref 0.50–1.10)
GFR calc non Af Amer: 10 mL/min — ABNORMAL LOW (ref >60)
Total Bilirubin: 0.4 mg/dL (ref 0.3–1.2)

## 2011-06-12 LAB — LIPID PANEL
LDL Cholesterol: 45 mg/dL (ref 0–99)
Total CHOL/HDL Ratio: 3.4 RATIO
VLDL: 41 mg/dL — ABNORMAL HIGH (ref 0–40)

## 2011-06-12 LAB — APTT: aPTT: 37 seconds (ref 24–37)

## 2011-06-12 LAB — PREALBUMIN: Prealbumin: 23.1 mg/dL (ref 17.0–34.0)

## 2011-06-12 LAB — CBC
HCT: 21.7 % — ABNORMAL LOW (ref 36.0–46.0)
MCH: 33.3 pg (ref 26.0–34.0)
MCV: 92.7 fL (ref 78.0–100.0)
RDW: 15.7 % — ABNORMAL HIGH (ref 11.5–15.5)
WBC: 19.8 10*3/uL — ABNORMAL HIGH (ref 4.0–10.5)

## 2011-06-12 LAB — DIFFERENTIAL
Eosinophils Relative: 0 % (ref 0–5)
Lymphocytes Relative: 11 % — ABNORMAL LOW (ref 12–46)
Lymphs Abs: 2.1 10*3/uL (ref 0.7–4.0)
Monocytes Absolute: 2 10*3/uL — ABNORMAL HIGH (ref 0.1–1.0)
Monocytes Relative: 10 % (ref 3–12)

## 2011-06-12 LAB — LIPASE, BLOOD: Lipase: 1682 U/L — ABNORMAL HIGH (ref 11–59)

## 2011-06-13 LAB — RENAL FUNCTION PANEL
BUN: 35 mg/dL — ABNORMAL HIGH (ref 6–23)
CO2: 24 mEq/L (ref 19–32)
Calcium: 8.7 mg/dL (ref 8.4–10.5)
Chloride: 94 mEq/L — ABNORMAL LOW (ref 96–112)
Creatinine, Ser: 3.16 mg/dL — ABNORMAL HIGH (ref 0.50–1.10)
GFR calc non Af Amer: 16 mL/min — ABNORMAL LOW (ref >60)
Glucose, Bld: 116 mg/dL — ABNORMAL HIGH (ref 70–99)

## 2011-06-13 LAB — CROSSMATCH
ABO/RH(D): O POS
Antibody Screen: NEGATIVE
Unit division: 0

## 2011-06-13 LAB — DIFFERENTIAL
Basophils Absolute: 0.1 10*3/uL (ref 0.0–0.1)
Basophils Relative: 0 % (ref 0–1)
Eosinophils Relative: 1 % (ref 0–5)
Monocytes Absolute: 1.6 10*3/uL — ABNORMAL HIGH (ref 0.1–1.0)
Neutro Abs: 15.3 10*3/uL — ABNORMAL HIGH (ref 1.7–7.7)

## 2011-06-13 LAB — LIPASE, BLOOD: Lipase: 997 U/L — ABNORMAL HIGH (ref 11–59)

## 2011-06-13 LAB — GLUCOSE, CAPILLARY
Glucose-Capillary: 114 mg/dL — ABNORMAL HIGH (ref 70–99)
Glucose-Capillary: 108 mg/dL — ABNORMAL HIGH (ref 70–99)
Glucose-Capillary: 124 mg/dL — ABNORMAL HIGH (ref 70–99)

## 2011-06-13 LAB — MAGNESIUM: Magnesium: 1.8 mg/dL (ref 1.5–2.5)

## 2011-06-13 LAB — CBC
MCHC: 36 g/dL (ref 30.0–36.0)
RDW: 16 % — ABNORMAL HIGH (ref 11.5–15.5)

## 2011-06-13 LAB — APTT: aPTT: 57 seconds — ABNORMAL HIGH (ref 24–37)

## 2011-06-14 LAB — CBC
Platelets: 423 10*3/uL — ABNORMAL HIGH (ref 150–400)
RBC: 2.98 MIL/uL — ABNORMAL LOW (ref 3.87–5.11)
WBC: 18.6 10*3/uL — ABNORMAL HIGH (ref 4.0–10.5)

## 2011-06-14 LAB — DIFFERENTIAL
Basophils Absolute: 0.1 10*3/uL (ref 0.0–0.1)
Basophils Relative: 0 % (ref 0–1)
Eosinophils Absolute: 0.1 10*3/uL (ref 0.0–0.7)
Neutro Abs: 15 10*3/uL — ABNORMAL HIGH (ref 1.7–7.7)
Neutrophils Relative %: 80 % — ABNORMAL HIGH (ref 43–77)

## 2011-06-14 LAB — RENAL FUNCTION PANEL
BUN: 62 mg/dL — ABNORMAL HIGH (ref 6–23)
CO2: 20 mEq/L (ref 19–32)
Calcium: 9.2 mg/dL (ref 8.4–10.5)
Chloride: 94 mEq/L — ABNORMAL LOW (ref 96–112)
Creatinine, Ser: 4.29 mg/dL — ABNORMAL HIGH (ref 0.50–1.10)

## 2011-06-14 LAB — URINE MICROSCOPIC-ADD ON

## 2011-06-14 LAB — GLUCOSE, CAPILLARY
Glucose-Capillary: 103 mg/dL — ABNORMAL HIGH (ref 70–99)
Glucose-Capillary: 107 mg/dL — ABNORMAL HIGH (ref 70–99)

## 2011-06-14 LAB — CULTURE, BLOOD (ROUTINE X 2)
Culture  Setup Time: 201207301659
Culture: NO GROWTH
Culture: NO GROWTH

## 2011-06-14 LAB — URINALYSIS, ROUTINE W REFLEX MICROSCOPIC
Bilirubin Urine: NEGATIVE
Nitrite: NEGATIVE
Specific Gravity, Urine: 1.015 (ref 1.005–1.030)
Urobilinogen, UA: 0.2 mg/dL (ref 0.0–1.0)
pH: 5 (ref 5.0–8.0)

## 2011-06-14 LAB — AMYLASE: Amylase: 302 U/L — ABNORMAL HIGH (ref 0–105)

## 2011-06-14 LAB — CLOSTRIDIUM DIFFICILE BY PCR: Toxigenic C. Difficile by PCR: NEGATIVE

## 2011-06-14 LAB — APTT: aPTT: 44 seconds — ABNORMAL HIGH (ref 24–37)

## 2011-06-15 ENCOUNTER — Inpatient Hospital Stay (HOSPITAL_COMMUNITY): Payer: Self-pay

## 2011-06-15 LAB — CBC
HCT: 24.3 % — ABNORMAL LOW (ref 36.0–46.0)
MCV: 93.1 fL (ref 78.0–100.0)
Platelets: 466 10*3/uL — ABNORMAL HIGH (ref 150–400)
RBC: 2.61 MIL/uL — ABNORMAL LOW (ref 3.87–5.11)
WBC: 16.2 10*3/uL — ABNORMAL HIGH (ref 4.0–10.5)

## 2011-06-15 LAB — URINE CULTURE
Colony Count: 8000
Culture  Setup Time: 201208051431

## 2011-06-15 LAB — COMPREHENSIVE METABOLIC PANEL
ALT: 34 U/L (ref 0–35)
Albumin: 2.1 g/dL — ABNORMAL LOW (ref 3.5–5.2)
Alkaline Phosphatase: 189 U/L — ABNORMAL HIGH (ref 39–117)
BUN: 83 mg/dL — ABNORMAL HIGH (ref 6–23)
Chloride: 94 mEq/L — ABNORMAL LOW (ref 96–112)
Glucose, Bld: 101 mg/dL — ABNORMAL HIGH (ref 70–99)
Potassium: 3.4 mEq/L — ABNORMAL LOW (ref 3.5–5.1)
Sodium: 126 mEq/L — ABNORMAL LOW (ref 135–145)
Total Bilirubin: 0.3 mg/dL (ref 0.3–1.2)

## 2011-06-15 LAB — DIFFERENTIAL
Lymphocytes Relative: 12 % (ref 12–46)
Lymphs Abs: 2 10*3/uL (ref 0.7–4.0)
Neutrophils Relative %: 78 % — ABNORMAL HIGH (ref 43–77)

## 2011-06-15 LAB — TRIGLYCERIDES: Triglycerides: 78 mg/dL (ref <150)

## 2011-06-15 LAB — LIPASE, BLOOD: Lipase: 544 U/L — ABNORMAL HIGH (ref 11–59)

## 2011-06-15 LAB — GLUCOSE, CAPILLARY: Glucose-Capillary: 103 mg/dL — ABNORMAL HIGH (ref 70–99)

## 2011-06-15 LAB — MAGNESIUM: Magnesium: 2.1 mg/dL (ref 1.5–2.5)

## 2011-06-15 LAB — PHOSPHORUS: Phosphorus: 6.5 mg/dL — ABNORMAL HIGH (ref 2.3–4.6)

## 2011-06-15 LAB — CHOLESTEROL, TOTAL: Cholesterol: 95 mg/dL (ref 0–200)

## 2011-06-16 DIAGNOSIS — I120 Hypertensive chronic kidney disease with stage 5 chronic kidney disease or end stage renal disease: Secondary | ICD-10-CM

## 2011-06-16 DIAGNOSIS — N186 End stage renal disease: Secondary | ICD-10-CM

## 2011-06-16 DIAGNOSIS — Z452 Encounter for adjustment and management of vascular access device: Secondary | ICD-10-CM

## 2011-06-16 LAB — HEPATIC FUNCTION PANEL
AST: 25 U/L (ref 0–37)
Albumin: 2.1 g/dL — ABNORMAL LOW (ref 3.5–5.2)
Total Bilirubin: 0.3 mg/dL (ref 0.3–1.2)

## 2011-06-16 LAB — RENAL FUNCTION PANEL
BUN: 40 mg/dL — ABNORMAL HIGH (ref 6–23)
CO2: 26 mEq/L (ref 19–32)
Chloride: 100 mEq/L (ref 96–112)
Creatinine, Ser: 2.43 mg/dL — ABNORMAL HIGH (ref 0.50–1.10)
GFR calc non Af Amer: 22 mL/min — ABNORMAL LOW (ref >60)

## 2011-06-16 LAB — GLUCOSE, CAPILLARY
Glucose-Capillary: 111 mg/dL — ABNORMAL HIGH (ref 70–99)
Glucose-Capillary: 117 mg/dL — ABNORMAL HIGH (ref 70–99)

## 2011-06-16 LAB — CBC
HCT: 23.8 % — ABNORMAL LOW (ref 36.0–46.0)
Hemoglobin: 8.2 g/dL — ABNORMAL LOW (ref 12.0–15.0)
MCH: 33.1 pg (ref 26.0–34.0)
MCV: 96 fL (ref 78.0–100.0)
RBC: 2.48 MIL/uL — ABNORMAL LOW (ref 3.87–5.11)

## 2011-06-16 LAB — FECAL LACTOFERRIN, QUANT: Fecal Lactoferrin: NEGATIVE

## 2011-06-16 LAB — APTT: aPTT: 46 seconds — ABNORMAL HIGH (ref 24–37)

## 2011-06-16 LAB — LIPASE, BLOOD: Lipase: 412 U/L — ABNORMAL HIGH (ref 11–59)

## 2011-06-17 LAB — CBC
Hemoglobin: 8.5 g/dL — ABNORMAL LOW (ref 12.0–15.0)
MCH: 32.7 pg (ref 26.0–34.0)
MCV: 96.5 fL (ref 78.0–100.0)
RBC: 2.6 MIL/uL — ABNORMAL LOW (ref 3.87–5.11)

## 2011-06-17 LAB — GLUCOSE, CAPILLARY
Glucose-Capillary: 106 mg/dL — ABNORMAL HIGH (ref 70–99)
Glucose-Capillary: 111 mg/dL — ABNORMAL HIGH (ref 70–99)

## 2011-06-17 LAB — RENAL FUNCTION PANEL
BUN: 53 mg/dL — ABNORMAL HIGH (ref 6–23)
CO2: 25 mEq/L (ref 19–32)
Chloride: 98 mEq/L (ref 96–112)
Creatinine, Ser: 2.77 mg/dL — ABNORMAL HIGH (ref 0.50–1.10)
Glucose, Bld: 106 mg/dL — ABNORMAL HIGH (ref 70–99)

## 2011-06-17 LAB — STOOL CULTURE

## 2011-06-17 NOTE — Progress Notes (Signed)
[name], LEGROS NO.:  [id number]  MEDICAL RECORD NO.:  [id number]  LOCATION:  6712                         FACILITY:  [facility]  PHYSICIAN:  Conley Canal, MD      DATE OF BIRTH:  1971/10/03                                PROGRESS NOTE   CONSULTING PHYSICIANS: 1. James L. Deterding, M.D. 2. Di Kindle. Edilia Bo, M.D. 3. Fayrene Fearing D. Hart Rochester, M.D. 4. Bernette Redbird, M.D. 5. Pulmonary critical care.  PROBLEM LIST: 1. Severe sepsis, resolved. 2. Legionella pneumonia completed course of antibiotics. 3. Severe pancreatitis, possibility of hemorrhagic pancreatitis. 4. Left retroperitoneal hematoma involving the iliac crest and     iliopsoas muscles. 5. History of alcoholism. 6. Acute blood loss anemia versus acute on chronic anemia of chronic     kidney disease. 7. Acute kidney injury versus chronic kidney disease versus end-stage     renal disease.  PROCEDURE PERFORMED: 1. CT abdomen and pelvis on June 10, 2011, which showed enlargement     of the pancreatic head and uncinate process with mild surrounding     peripancreatic stranding, possibly reflecting pancreatitis and     cannot assess for pancreatic necrosis in the absence of contrast     media who also showed left retroperitoneal hematoma involving the     iliac crest and iliopsoas muscles as well as right lower lobe     opacity suspicious for pneumonia, which has improved. 2. Multiple chest x-ray, last one on June 10, 2011, showing right     lower lobe airspace disease. 3. Abdominal ultrasound on June 08, 2011, showed no evidence for acute     cholecystitis or gallstones. 4. Renal ultrasound on June 02, 2011, showed bilateral enlarged     kidneys with echogenic renal cortices with hypoechoic renal     pyramids suggestive of acute tubular necrosis or acute pulmonary     nephritis. 5. A 2-D echocardiogram on June 02, 2011, showed EF of 55% to 60% with     normal wall motion and grade 1 diastolic  dysfunction. 6. Diatek catheter placement via right internal jugular vein placement     on June 10, 2011, by Dr. Hart Rochester.  HOSPITAL COURSE: [name] is an extremely pleasant 40 year old lady who came in on June 01, 2011, with complaints of shortness of breath, nausea, vomiting, and weakness ongoing for 3 days prior to the admission.  At admission, she was found to have right lower lobe pneumonia and a CT abdomen and pelvis around the time of her admission showed some nonspecific left anterior pararenal and perinephric fat stranding with possibility of pyelonephritis and pancreatitis.  She subsequently had a repeat CT abdomen and pelvis due to left-sided abdominal and leg pains with eventual findings of pancreatitis and left retroperitoneal hematoma. Her septic workup was significant for positive urine Legionella antigen, consistently negative blood cultures on June 04, 2011 and June 14, 2011.  She was treated with broad-spectrum antibiotics with good improvement in her white count which went all the way to 29,000.  The white count has continued to improve and is now at 11.3.  The patient had episodes of diarrhea prompting concern for C. diff colitis and C.  difficile PCR was negative x1.  However, she was placed empirically on Flagyl at which point, her white count started to decline again after it had stayed constant around 20,000.  The patient was found to have severe pancreatitis for which she was placed n.p.o. and was seen by Gastroenterology who agreed with supportive care including TPN.  She has improved abdominal wise and is now hungry, has been tolerating clearly liquids and her amylase and lipase has continued to improve.  GI, Dr. Dulce Sellar has been graciously following the patient from a distance.  Renal wise, the patient has been diligently managed by [facility].  She required acute dialysis and now seems to be in nonoliguric phase with gradual improvement in  her BUN and creatinine. Today, her BUN is 40, creatinine is 2.43, and potassium 3.4.  The patient has worked with physical therapy and plans to eventually go home to the care of her family.  She feels okay, is hungry.  PHYSICAL EXAMINATION: GENERAL:  The patient not in acute distress. VITAL SIGNS:  Blood pressure 137/85, heart rate is 88, temperature 98.2, heart rate 88, respirations 22, oxygen saturation is 95% on room air. HEAD, EARS, NOSE, AND THROAT:  Left IJ, no jugular venous distention. RESPIRATORY:  Good air entry bilaterally with no rhonchi, rales, or wheezes. CARDIOVASCULAR:  First and second heart sounds heard.  No murmurs. Pulse regular. ABDOMEN:  Soft, nontender.  No palpable organomegaly.  Bowel sounds normal. CNS:  Grossly intact. EXTREMITIES:  No pedal edema.  Peripheral pulses equal.  LABORATORY DATA: Labs reviewed significant for white count 11.3, hemoglobin 8.2, hematocrit 23.8, and platelet count 409.  CBG 118-128, amylase 176, lipase 412.  PLAN: 1. Acute kidney injury, renal appreciated.  Seems to be in nonoliguric     phase.  Dialysis discontinued as the patient is improving.  Monitor     electrolytes and replenish accordingly. 2. Severe pancreatitis, improving on TPN.  We will advance diet if     tolerating, could discontinue TPN in the next day or two. 3. Anemia.  Monitor transfuse PRBC as necessary could be related to     retroperitoneal hematoma, avoid anticoagulants. 4. Sepsis Legionella pneumonia leukocytosis, completed course of     antibiotics.  We would continue Flagyl, which is being used     empirically. 5. Disposition, eventually home.     Conley Canal, MD     SR/MEDQ  D:  06/16/2011  T:  06/16/2011  Job:  [id number]  Electronically Signed by Conley Canal  on 06/17/2011 10:12:13 PM
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
## 2011-06-18 LAB — APTT: aPTT: 39 seconds — ABNORMAL HIGH (ref 24–37)

## 2011-06-18 LAB — COMPREHENSIVE METABOLIC PANEL
ALT: 21 U/L (ref 0–35)
Albumin: 2.2 g/dL — ABNORMAL LOW (ref 3.5–5.2)
Alkaline Phosphatase: 145 U/L — ABNORMAL HIGH (ref 39–117)
BUN: 59 mg/dL — ABNORMAL HIGH (ref 6–23)
Potassium: 3.5 mEq/L (ref 3.5–5.1)
Sodium: 135 mEq/L (ref 135–145)
Total Protein: 5.9 g/dL — ABNORMAL LOW (ref 6.0–8.3)

## 2011-06-18 LAB — MAGNESIUM: Magnesium: 1.5 mg/dL (ref 1.5–2.5)

## 2011-06-19 LAB — BASIC METABOLIC PANEL
BUN: 50 mg/dL — ABNORMAL HIGH (ref 6–23)
Chloride: 106 mEq/L (ref 96–112)
GFR calc Af Amer: 30 mL/min — ABNORMAL LOW (ref >60)
Potassium: 3.4 mEq/L — ABNORMAL LOW (ref 3.5–5.1)

## 2011-06-20 LAB — CULTURE, BLOOD (ROUTINE X 2): Culture: NO GROWTH
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
## 2011-06-24 NOTE — Discharge Summary (Signed)
[name], [name] NO.:  [id number]  MEDICAL RECORD NO.:  [id number]  LOCATION:  5284                         FACILITY:  [facility]  PHYSICIAN:  Nancy Scott, MD     DATE OF BIRTH:  August 22, 1971  DATE OF ADMISSION:  06/01/2011 DATE OF DISCHARGE:  06/19/2011                              DISCHARGE SUMMARY   PRIMARY CARE PHYSICIAN:  The patient did not have a primary care MD, but has an appointment to be seen at [facility].  DISCHARGE DIAGNOSES: 1. Severe sepsis. 2. Legionella community-acquired pneumonia. 3. Acute renal failure, improving. 4. Anion gap metabolic acidosis, resolved. 5. Hypokalemia. 6. Moderate protein-calorie malnutrition. 7. Acute pancreatitis. 8. Abnormal liver function tests, possibly from alcohol abuse. 9. Acute respiratory failure secondary to pneumonia.  Acute     respiratory failure resolved. 10.Toxic metabolic encephalopathy, resolved. 11.Retroperitoneal hematoma, stable. 12.Acute pancreatitis. 13.Anemia. 14.Hypomagnesemia. 15.History of alcohol dependence.  DISCHARGE MEDICATIONS: 1. Folic acid 1 mg p.o. daily. 2. Metronidazole 500 mg p.o. t.i.d. to complete a total 2-week course. 3. Nutritional supplement liquid 240 mL p.o. t.i.d. 4. Renal formula vitamin tablet, 1 tablet p.o. daily. 5. Thiamine 100 mg p.o. daily. 6. Tramadol 50 mg p.o. q.6 hourly p.r.n. for pain.  PROCEDURES: 1. Intubation for mechanical ventilation and extubation. 2. Temporary hemodialysis catheter placement. 3. Central line placement.  IMAGING: 1. CT of the abdomen and pelvis without contrast on June 10, 2011,     impression:  Enlargement of the pancreatic head/uncinate process     with mild surrounding peripancreatic stranding, possibly reflecting     pancreatitis.  Cannot assess for pancreatic necrosis in the absence     of contrast administration.  Left retroperitoneal hematoma     involving the iliac and iliopsoas muscles.  Right lower  lobe     opacity suspicious for pneumonia, mildly improved. 2. Multiple chest x-rays to evaluate her respiratory status and the     last one on June 10, 2011, impression:  Right internal jugular     vein tunnel dialysis catheter placement with its tip at the     cavoatrial junction and no pneumothorax. 3. Left femur x-ray on June 08, 2011:  Negative. 4. Left knee x-ray on June 08, 2011, impression:  Negative. 5. Abdominal ultrasound on June 08, 2011, impression:     a.     No evidence for acute cholecystitis or gallstones.     b.     Normal ultrasound appearance of the pancreas. 6. Abdominal x-ray on June 07, 2011, impression:  Nasogastric tube is     in the stomach.  Fine films otherwise unremarkable. 7. Renal ultrasound on June 02, 2011, impression:  Bilateral enlarged     kidneys with echogenic renal cortices with hyperechoic renal     pyramid suggestive of acute tubular necrosis or acute pulmonary     nephritis. 8. CT of the abdomen and pelvis with contrast on June 02, 2011,     impression:  Right lower lobe consolidation, concerning for     pneumonia.  Nonspecific left anterior pararenal and perinephric fat     stranding. 9. Chest x-ray on June 01, 2011, impression:  Possible consolidation,  right lower lobe.  Lateral view was recommended. 10.2-D echocardiogram, June 02, 2011.  Left ventricular ejection     fraction was 55-60%.  Left ventricular wall motion was normal and     there were no regional wall motion abnormalities. 11.Bilateral lower extremity venous Dopplers on June 02, 2011.  No     evidence of deep vein or superficial thrombosis involving the right     lower extremity and left lower extremity.  LABORATORY DATA:  Basic metabolic panel today is significant for potassium 3.4, BUN 50, creatinine 2.19, phosphorus 5.4, and magnesium 1.5.  Hepatic panel significant for alkaline phosphatase 145, total protein 5.9, and albumin 2.2.  Stool cultures were negative but  showed reduced normal flora.  Lipase on June 17, 2011 was 220.  Prealbumin was 26 on June 15, 2011.  Urine culture was suggestive of contamination. Blood cultures x2 from June 14, 2011 was no growth to date.  Blood cultures from June 08, 2011 were no growth on final report.  C. difficile PCR on June 14, 2011 was negative.  Urine drug screen was positive for marijuana.  Lipid panel was significant for triglycerides 204, HDL 36, VLDL 41.  Hepatitis B core antibody was negative. Hepatitis B surface antibody was negative.  Hepatitis B surface antigen was negative.  Ferritin was 1454.  Blood cultures from June 01, 2011 with no growth on final report.  Bronchoalveolar large culture was negative.  ANA was negative.  Acute hepatitis panel was negative.  Urine culture from June 02, 2011 was negative.  Urine drug screen from June 02, 2011 was positive for marijuana and benzodiazepines.  C4 complement was 39, C3 complement 157.  D-dimer was 13.22 and 12.64.  Lactate was 1.3.  CONSULTATIONS: 1. James L. Deterding, MD, Nephrology 2. Di Kindle. Edilia Bo, MD, Vascular Surgery 3. Bernette Redbird, MD, Gastroenterology 4. Pulmonary Critical Care Team.  DIET:  Regular diet.  ACTIVITIES:  Increase activity gradually.  COMPLAINTS:  The patient indicates that she feels well and indicates that she is eager to go home.  The patient has complained of left-sided hip and knee pain.  She is able to weightbear with support.  PHYSICAL EXAMINATION:  GENERAL:  The patient is in no obvious distress. VITAL SIGNS:  Temperature 98.4 degrees Fahrenheit, pulse 82 per minute, respirations 20 per minute, blood pressure 134/87 mmHg, and saturating at 96% on room air.  Input is 1975 mL and output 4850 mL. RESPIRATORY SYSTEM:  Clear. CARDIOVASCULAR SYSTEM:  First and second heart sounds heard, regular. No JVD. ABDOMEN:  Nondistended, soft, and bowel sounds present. CENTRAL NERVOUS SYSTEM:  The patient awake,  alert, and oriented x3 with no focal neurological deficits.  HOSPITAL COURSE:  [name] is a 40 year old female patient who has no prior past medical history and had not seen a primary care physician for routine medical care.  She presented to the emergency department with history of dyspnea, nausea, vomiting, and weakness 3 days prior to admission.  She was not able to keep anything down.  She was initially found to have right lower lobe pneumonia.  She was admitted to the Step- Down bed.  However, her condition rapidly declined on the night of admission where she had tachypnea, reduced responsiveness, tachycardia suggestive of severe sepsis.  The patient was transferred to the Intensive Care Unit and her care was taken over by the Critical Care Team until her condition was stabilized following which her care was transferred back to the [facility]. 1. Severe  sepsis secondary to Legionella pneumonia and acute     pancreatitis.  The patient was placed on multiple broad-spectrum     antibiotics and placed on sepsis protocol.  She was hydrated with     IV fluids.  She has completed treatment for pneumonia. 2. Acute respiratory failure which is possibly multifactorial     secondary to severe sepsis, pneumonia, and altered mental status.     The patient was intubated for mechanical ventilation.  Once she was     stabilized, she was extubated.  She has been stable from a     respiratory standpoint for days. 3. Acute renal failure which was possibly secondary to her sepsis and     dehydration.  She underwent hemodialysis.  Currently, she is in the     polyuric phase and her creatinine has continued to gradually     improve.  The renal physicians have cleared her for discharge and     intend to follow her labs and the patient is an outpatient. 4. Toxic metabolic encephalopathy, possibly from her acute medical     illness including sepsis, acute renal failure, and metabolic      acidosis.  This has resolved and the patient has been coherent now     for days. 5. Diarrhea.  Although her C. difficile PCR returned negative, with     initiation of Flagyl, she seemed to clinically do well with     improvement in her diarrhea and leukocytosis.  She will hence     complete a total 2 weeks course of Flagyl. 6. Acute pancreatitis, possibly related to alcohol abuse.  The patient     was on TNA until her oral intake had improved and her TNA has been     discontinued.  She has not complained of any abdominal pain.  She     is tolerating regular diet and has had BMs.  She has been counseled     regarding abstinence from alcohol abuse.  She verbalizes     understanding. 7. Hypokalemia, the patient will be repleted prior to discharge. 8. Left retroperitoneal bleed.  Unclear etiology.  Wonder if her left-     sided hip pain is radiation pain from this.  She was transfused     packed red blood cells. 9. Anemia, which is stable.  DISPOSITION:  The patient is discharged home in stable condition.  FOLLOWUP RECOMMENDATIONS: 1. Home Health RN will draw labs including a renal panel, CBC, and the     results are to be faxed to Dr. Casimiro Needle whose fax number is [phone].  Dr. Roanna Banning office will also arrange for the patient to     follow up with him in 2-4 weeks from hospital discharge. 2. Follow up with the [facility] on July 07, 2011 at 12 noon     for eligibility appointment. 3. Follow up with Dr. Andrey Campanile at the [facility] on July 27, 2011 at 3:15 p.m.  TIME SPENT:  Time taken in coordinating this discharge summary was 1 hour.     Nancy Scott, MD     AH/MEDQ  D:  06/19/2011  T:  06/20/2011  Job:  [id number]  cc:   Mindi Slicker. Lowell Guitar, M.D. Georganna Skeans, MD  Electronically Signed by Nancy Scott MD on 06/24/2011 07:12:59 PM

## 2011-07-09 NOTE — Op Note (Signed)
  [name], [name] NO.:  [id number]  MEDICAL RECORD NO.:  [id number]  LOCATION:                                 FACILITY:  PHYSICIAN:  Quita Skye. Hart Rochester, M.D.  DATE OF BIRTH:  03/24/1971  DATE OF PROCEDURE:  06/10/2011 DATE OF DISCHARGE:                              OPERATIVE REPORT   PREOPERATIVE DIAGNOSIS:  End-stage renal disease.  POSTOPERATIVE DIAGNOSIS:  End-stage renal disease.  OPERATION: 1. Bilateral ultrasound localization internal jugular veins. 2. Insertion Diatek catheter via right internal jugular vein (19 cm).  SURGEON:  Quita Skye. Hart Rochester, MD  ASSISTANT:  Nurse.  ANESTHESIA:  LMA.  PROCEDURE:  The patient was taken to the operating room, placed in the supine position, upper chest and neck were exposed, both internal jugular veins were then imaged using B-mode ultrasound, both noted to be widely patent with excellent flow.  A temporary catheter had been removed from the right neck prior to the procedure which had been placed in the internal jugular vein high in the neck.  After prepping and draping in routine sterile manner, right internal jugular vein was entered using a supraclavicular approach.  Guidewire passed into the right atrium and inferior vena cava under fluoroscopic guidance.  After dilating the tract appropriately, a 19-cm Diatek catheter was passed through the peel-away sheath, positioned in the right atrium, tunneled peripherally and secured with nylon sutures.  The wound was closed with Vicryl in a subcuticular fashion.  Sterile dressing applied.  The patient was taken to the recovery room in satisfactory condition.     Quita Skye Hart Rochester, M.D.     JDL/MEDQ  D:  06/10/2011  T:  06/10/2011  Job:  [id number]  Electronically Signed by Josephina Gip M.D. on 07/09/2011 09:20:35 AM

## 2011-08-26 ENCOUNTER — Other Ambulatory Visit: Payer: Self-pay | Admitting: Nephrology

## 2011-08-26 ENCOUNTER — Ambulatory Visit
Admission: RE | Admit: 2011-08-26 | Discharge: 2011-08-26 | Disposition: A | Discharge status: Home / Self Care | Payer: Medicaid Other | Source: Ambulatory Visit | Attending: Nephrology | Admitting: Nephrology

## 2011-08-26 DIAGNOSIS — R609 Edema, unspecified: Secondary | ICD-10-CM

## 2011-09-29 ENCOUNTER — Ambulatory Visit: Payer: Medicaid Other | Attending: Family Medicine

## 2011-09-29 DIAGNOSIS — IMO0001 Reserved for inherently not codable concepts without codable children: Secondary | ICD-10-CM | POA: Insufficient documentation

## 2011-09-29 DIAGNOSIS — M2550 Pain in unspecified joint: Secondary | ICD-10-CM | POA: Insufficient documentation

## 2011-09-29 DIAGNOSIS — M6281 Muscle weakness (generalized): Secondary | ICD-10-CM | POA: Insufficient documentation

## 2011-10-07 ENCOUNTER — Ambulatory Visit: Payer: Medicaid Other | Admitting: Physical Therapy

## 2011-10-13 ENCOUNTER — Encounter: Payer: Medicaid Other | Admitting: Physical Therapy

## 2011-10-20 ENCOUNTER — Ambulatory Visit: Payer: Medicaid Other | Attending: Family Medicine

## 2011-10-20 DIAGNOSIS — M2550 Pain in unspecified joint: Secondary | ICD-10-CM | POA: Insufficient documentation

## 2011-10-20 DIAGNOSIS — IMO0001 Reserved for inherently not codable concepts without codable children: Secondary | ICD-10-CM | POA: Insufficient documentation

## 2011-10-20 DIAGNOSIS — M6281 Muscle weakness (generalized): Secondary | ICD-10-CM | POA: Insufficient documentation

## 2011-10-29 ENCOUNTER — Encounter: Payer: Medicaid Other | Admitting: Rehabilitation

## 2011-11-12 ENCOUNTER — Encounter: Payer: Medicaid Other | Admitting: Rehabilitation

## 2012-06-13 IMAGING — CR DG CHEST 1V PORT
1 series · 1 of 1 positions shown · non-contrast
Comparison: 06/10/2011 [DATE] hours

CLINICAL DATA: Renal failure.  Dialysis catheter placement.

PORTABLE CHEST - 1 VIEW

[view not recorded]
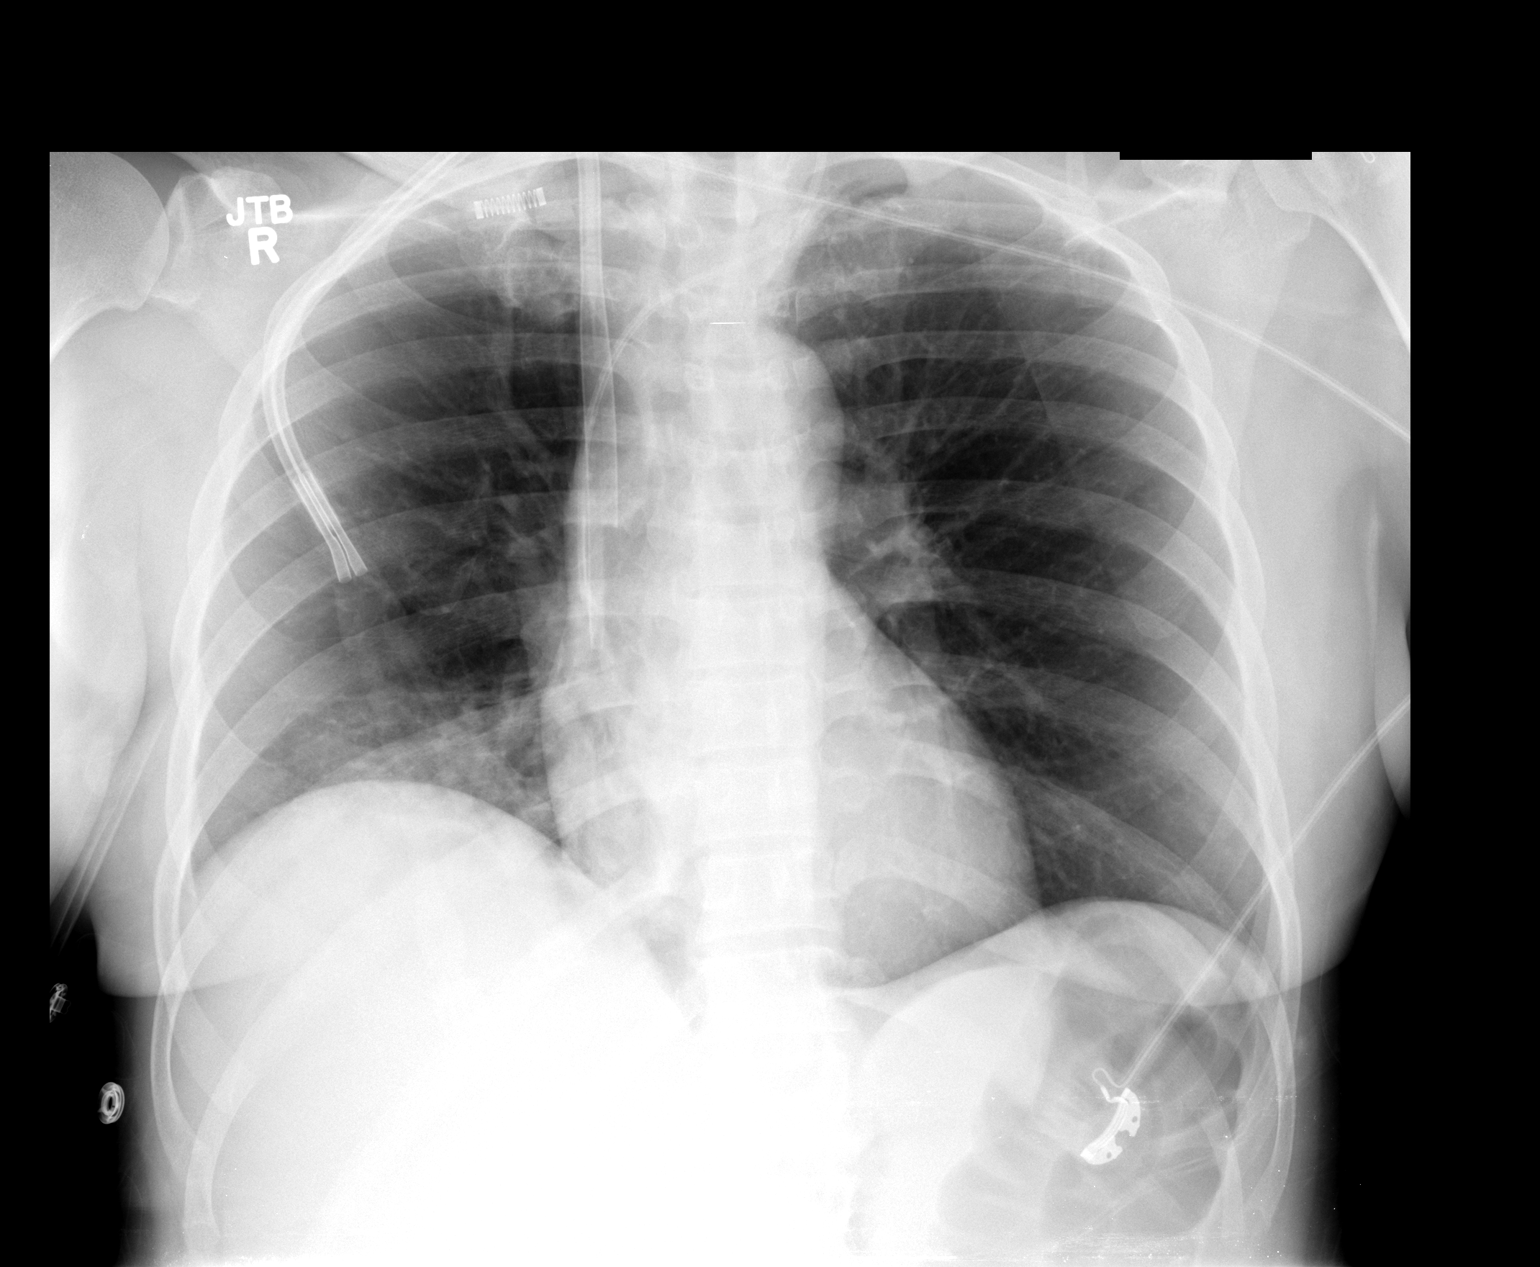

[1 of 1 positions shown; findings below may reference images not displayed]

FINDINGS: A right internal jugular vein tunneled dialysis catheter
has been placed.  Tip is at the cavoatrial junction.  No
pneumothorax.  Stable right basilar atelectasis.  Stable left
internal jugular central venous catheter.
IMPRESSION: Right internal jugular vein tunneled dialysis catheter placement
with its tip at the cavoatrial junction and no pneumothorax.

## 2012-06-13 IMAGING — CT CT ABD-PELV W/O CM
3 of 4 series · 14 of 32 positions shown, 19 images · non-contrast
Comparison: 06/02/2011

***ADDENDUM*** CREATED: 06/10/2011 [DATE]

Findings discussed with and acknowledged by Dr. Nolasco Delorenzo on
06/10/2011 at 6262 hours.
***END ADDENDUM*** SIGNED BY: William Henry Ambrocio, M.D.
CLINICAL DATA: Abdominal pain, nausea/vomiting, pneumonia, evaluate
for pancreatic necrosis
CT ABDOMEN AND PELVIS WITHOUT CONTRAST
TECHNIQUE: Multidetector CT imaging of the abdomen and pelvis was
performed following the standard protocol without intravenous
contrast.

[Series 2: routine abdomen w/o · axial · non-contrast · 0.69mm/px · z∈[-316,-61]mm · 4 of 87 slices shown, 9 images]
[im 18/87  soft-tissue]
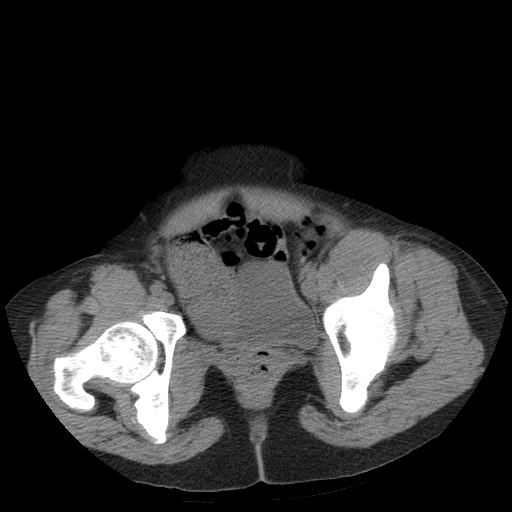
[im 18/87  lung]
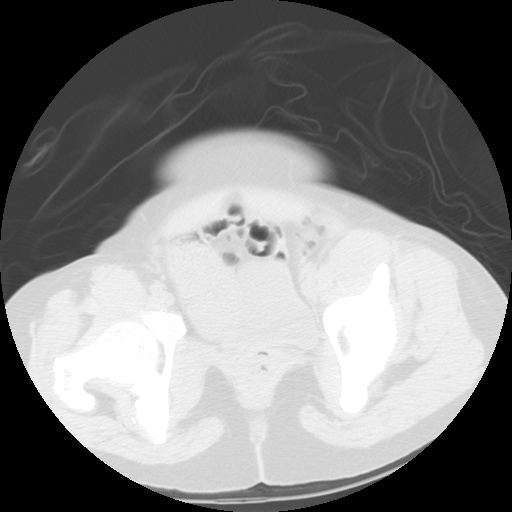
[im 18/87  bone]
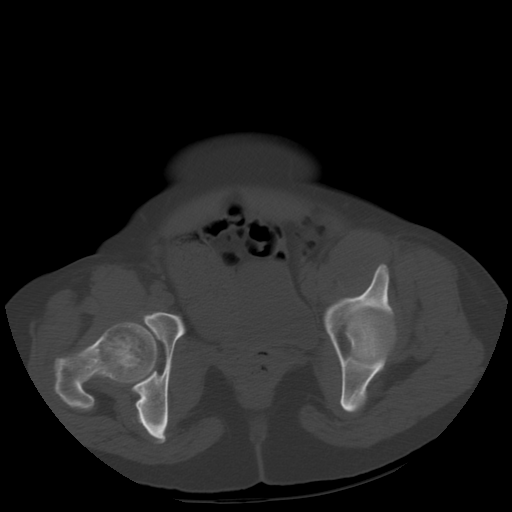
[im 35/87  soft-tissue]
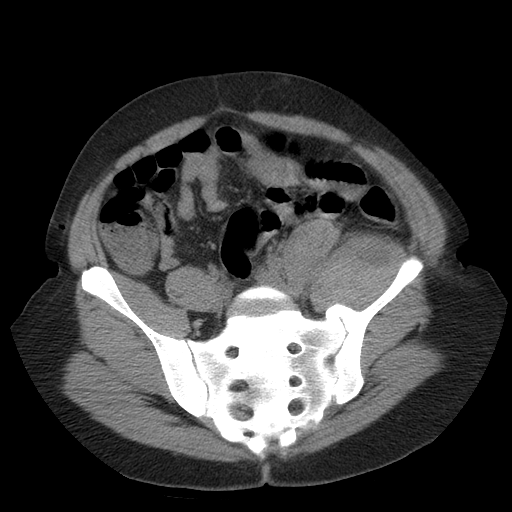
[im 35/87  lung]
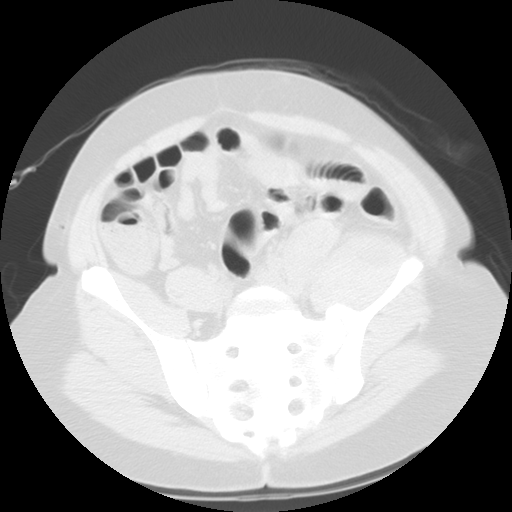
[im 52/87  soft-tissue]
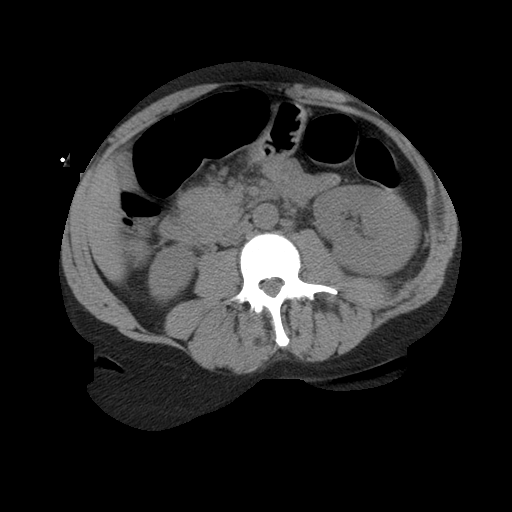
[im 52/87  lung]
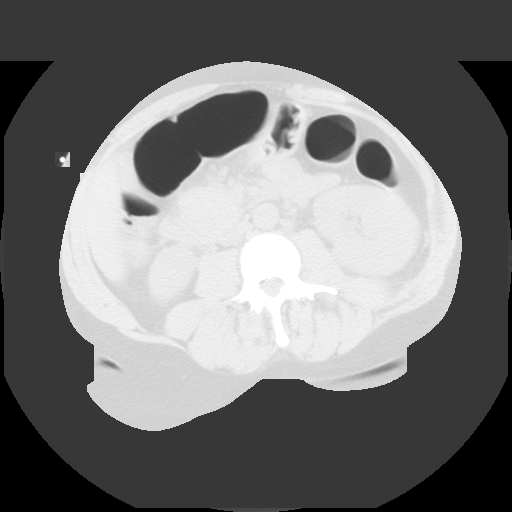
[im 69/87  soft-tissue]
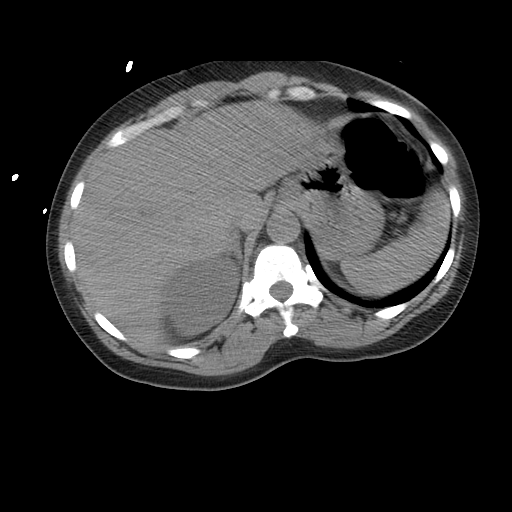
[im 69/87  lung]
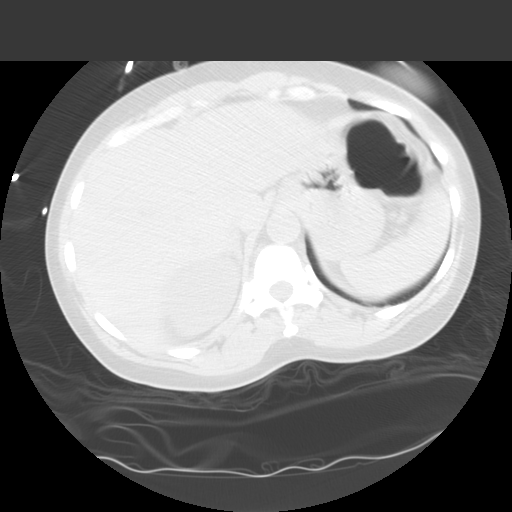

[Series 401: sagittals · sagittal · 0.86mm/px · 8 of 163 slices shown]
[im 15/163  soft-tissue]
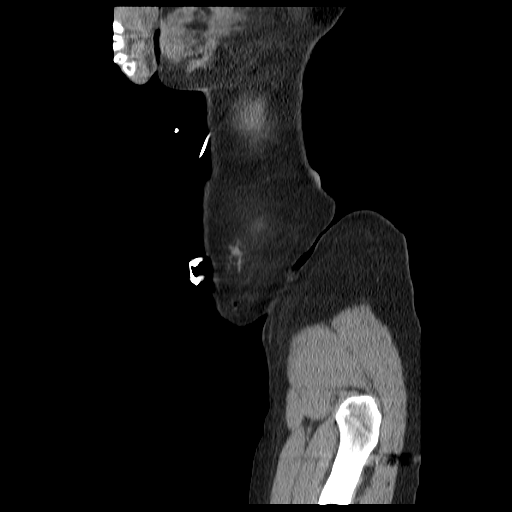
[im 30/163  soft-tissue]
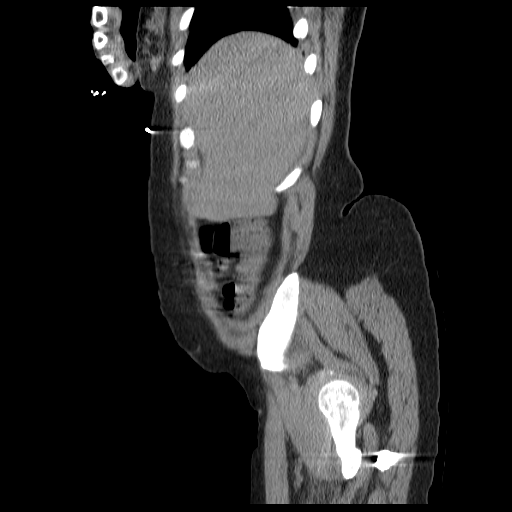
[im 59/163  soft-tissue]
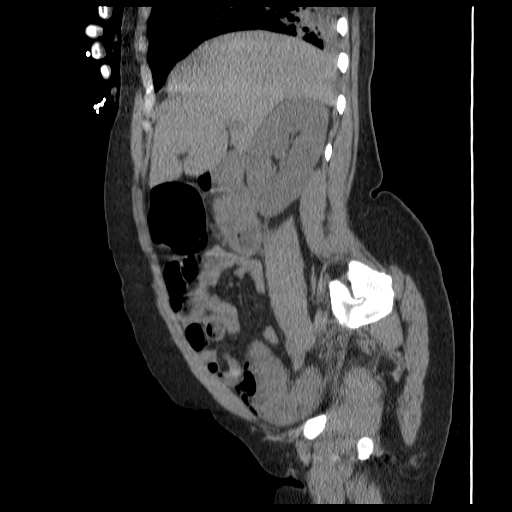
[im 74/163  soft-tissue]
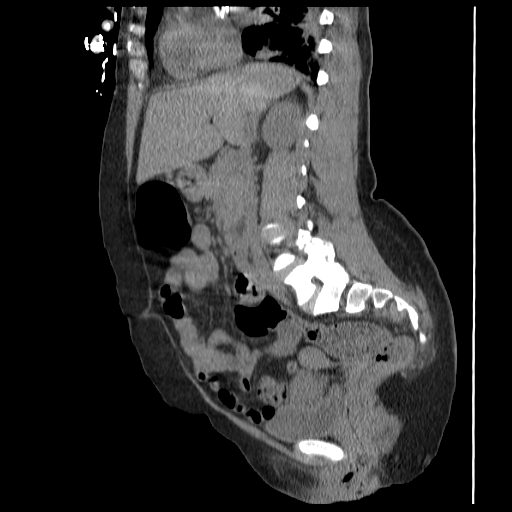
[im 89/163  soft-tissue]
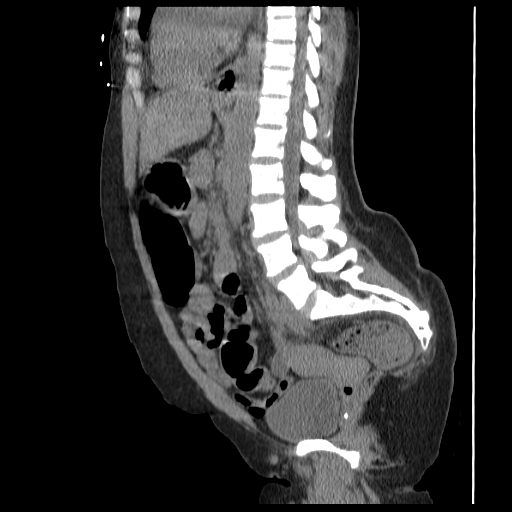
[im 104/163  soft-tissue]
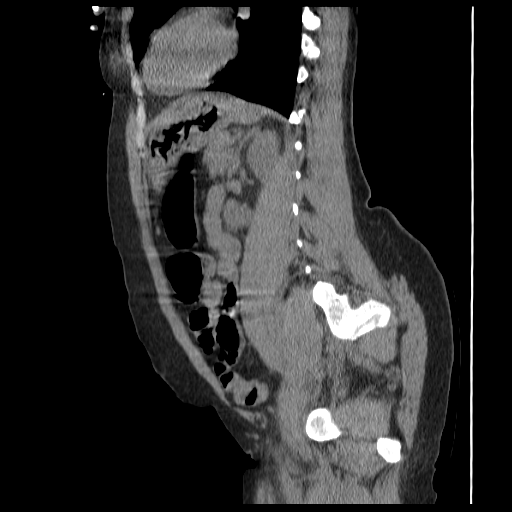
[im 133/163  soft-tissue]
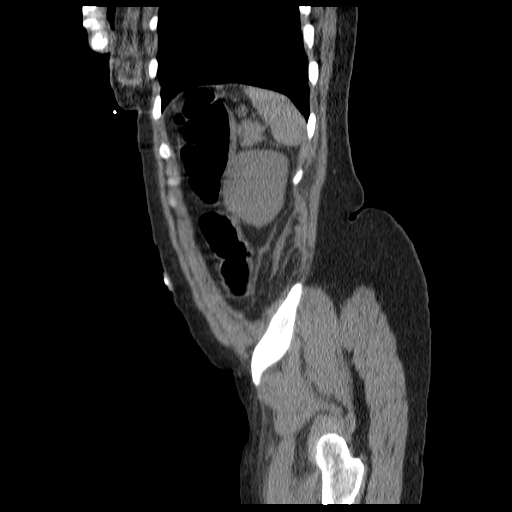
[im 148/163  soft-tissue]
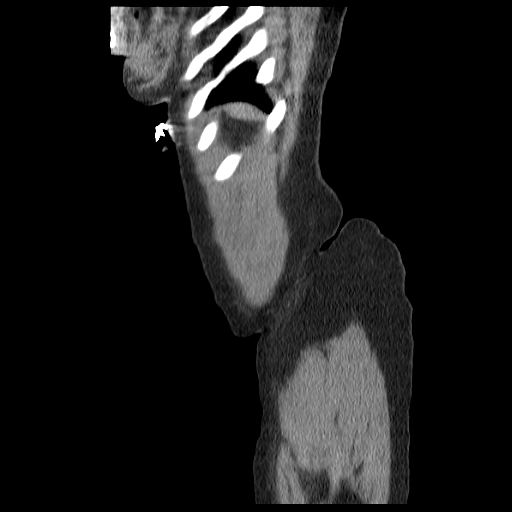

[Series 402: (id) · coronal · 0.86mm/px · 2 of 164 slices shown]
[im 30/164  soft-tissue]
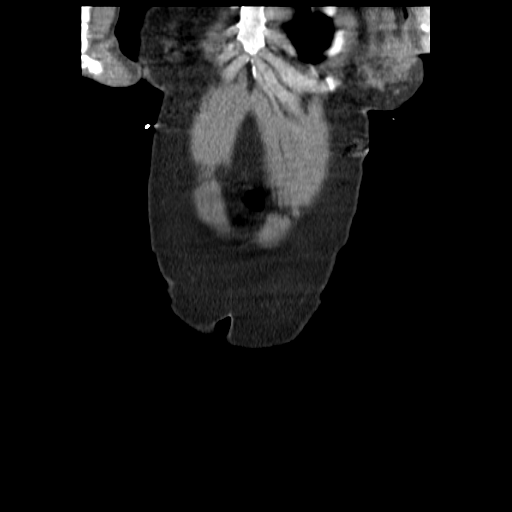
[im 45/164  soft-tissue]
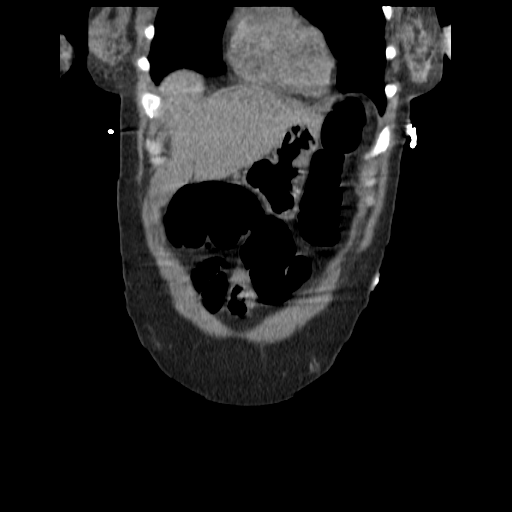

[14 of 32 positions shown; findings below may reference images not displayed]

FINDINGS: Right lower lobe opacity, suspicious for pneumonia,
mildly improved.

Unenhanced liver, spleen, and adrenal glands are within normal
limits.

Enlargement of the pancreatic head/uncinate process with mild
surrounding peripancreatic stranding, possibly reflecting
pancreatitis.

Kidneys are unremarkable.  No hydronephrosis.

No evidence of bowel obstruction.

No abdominopelvic ascites.  No suspicious abdominopelvic
lymphadenopathy.

No evidence of abdominal aortic aneurysm.

Uterus is unremarkable.  No adnexal masses.

Bladder is within normal limits.

Enlargement / heterogeneity of the left iliacus muscle (series
2/image 55) with extension into the iliopsoas (series 2/image 84)
and associated hemorrhage anteriorly in the left retroperitoneum
(series 2/image 45).

Degenerative changes of the visualized thoracolumbar spine and left
sacroiliac joint.
IMPRESSION: Enlargement of the pancreatic head/uncinate process with mild
surrounding peripancreatic stranding, possibly reflecting
pancreatitis.  Cannot assess for pancreatic necrosis in the absence
of contrast administration.

Left retroperitoneal hematoma involving the iliacus and iliopsoas
muscles.

Right lower lobe opacity, suspicious for pneumonia, mildly
improved.

## 2012-07-13 ENCOUNTER — Emergency Department (HOSPITAL_COMMUNITY): Payer: Medicaid Other

## 2012-07-13 ENCOUNTER — Inpatient Hospital Stay (HOSPITAL_COMMUNITY): Payer: Medicaid Other

## 2012-07-13 ENCOUNTER — Encounter (HOSPITAL_COMMUNITY): Payer: Self-pay | Admitting: Neurology

## 2012-07-13 ENCOUNTER — Inpatient Hospital Stay (HOSPITAL_COMMUNITY)
Admission: EM | Admit: 2012-07-13 | Discharge: 2012-07-15 | DRG: 071 | Disposition: A | Discharge status: Home Health | Payer: Medicaid Other | Attending: Internal Medicine | Admitting: Internal Medicine

## 2012-07-13 DIAGNOSIS — J209 Acute bronchitis, unspecified: Secondary | ICD-10-CM | POA: Diagnosis present

## 2012-07-13 DIAGNOSIS — F1410 Cocaine abuse, uncomplicated: Secondary | ICD-10-CM | POA: Diagnosis present

## 2012-07-13 DIAGNOSIS — Z8673 Personal history of transient ischemic attack (TIA), and cerebral infarction without residual deficits: Secondary | ICD-10-CM

## 2012-07-13 DIAGNOSIS — F10939 Alcohol use, unspecified with withdrawal, unspecified: Secondary | ICD-10-CM | POA: Diagnosis not present

## 2012-07-13 DIAGNOSIS — F1411 Cocaine abuse, in remission: Secondary | ICD-10-CM

## 2012-07-13 DIAGNOSIS — G9340 Encephalopathy, unspecified: Principal | ICD-10-CM | POA: Diagnosis present

## 2012-07-13 DIAGNOSIS — F1210 Cannabis abuse, uncomplicated: Secondary | ICD-10-CM | POA: Diagnosis present

## 2012-07-13 DIAGNOSIS — R197 Diarrhea, unspecified: Secondary | ICD-10-CM | POA: Diagnosis not present

## 2012-07-13 DIAGNOSIS — F17200 Nicotine dependence, unspecified, uncomplicated: Secondary | ICD-10-CM | POA: Diagnosis present

## 2012-07-13 DIAGNOSIS — J040 Acute laryngitis: Secondary | ICD-10-CM | POA: Diagnosis present

## 2012-07-13 DIAGNOSIS — R4182 Altered mental status, unspecified: Secondary | ICD-10-CM

## 2012-07-13 DIAGNOSIS — F1010 Alcohol abuse, uncomplicated: Secondary | ICD-10-CM | POA: Diagnosis present

## 2012-07-13 DIAGNOSIS — R569 Unspecified convulsions: Secondary | ICD-10-CM

## 2012-07-13 DIAGNOSIS — G8110 Spastic hemiplegia affecting unspecified side: Secondary | ICD-10-CM | POA: Diagnosis present

## 2012-07-13 DIAGNOSIS — F10929 Alcohol use, unspecified with intoxication, unspecified: Secondary | ICD-10-CM

## 2012-07-13 DIAGNOSIS — R269 Unspecified abnormalities of gait and mobility: Secondary | ICD-10-CM | POA: Diagnosis present

## 2012-07-13 DIAGNOSIS — R471 Dysarthria and anarthria: Secondary | ICD-10-CM | POA: Diagnosis present

## 2012-07-13 DIAGNOSIS — F10239 Alcohol dependence with withdrawal, unspecified: Secondary | ICD-10-CM | POA: Diagnosis not present

## 2012-07-13 DIAGNOSIS — I639 Cerebral infarction, unspecified: Secondary | ICD-10-CM

## 2012-07-13 DIAGNOSIS — J40 Bronchitis, not specified as acute or chronic: Secondary | ICD-10-CM | POA: Diagnosis present

## 2012-07-13 DIAGNOSIS — F1910 Other psychoactive substance abuse, uncomplicated: Secondary | ICD-10-CM | POA: Diagnosis present

## 2012-07-13 HISTORY — DX: Cerebral infarction, unspecified: I63.9

## 2012-07-13 LAB — DIFFERENTIAL
Basophils Relative: 0 % (ref 0–1)
Eosinophils Absolute: 0 10*3/uL (ref 0.0–0.7)
Eosinophils Relative: 0 % (ref 0–5)
Lymphs Abs: 2.8 10*3/uL (ref 0.7–4.0)

## 2012-07-13 LAB — CBC
MCH: 34.3 pg — ABNORMAL HIGH (ref 26.0–34.0)
MCHC: 35.4 g/dL (ref 30.0–36.0)
MCHC: 35.7 g/dL (ref 30.0–36.0)
MCV: 97 fL (ref 78.0–100.0)
MCV: 97.5 fL (ref 78.0–100.0)
Platelets: 262 10*3/uL (ref 150–400)
Platelets: 256 10*3/uL (ref 150–400)
RBC: 4.31 MIL/uL (ref 3.87–5.11)
RDW: 13.3 % (ref 11.5–15.5)
WBC: 8.9 10*3/uL (ref 4.0–10.5)

## 2012-07-13 LAB — COMPREHENSIVE METABOLIC PANEL
ALT: 39 U/L — ABNORMAL HIGH (ref 0–35)
BUN: 4 mg/dL — ABNORMAL LOW (ref 6–23)
Calcium: 9.6 mg/dL (ref 8.4–10.5)
GFR calc Af Amer: >90 mL/min (ref >90)
Glucose, Bld: 103 mg/dL — ABNORMAL HIGH (ref 70–99)
Sodium: 138 mEq/L (ref 135–145)
Total Protein: 7.8 g/dL (ref 6.0–8.3)

## 2012-07-13 LAB — GLUCOSE, CAPILLARY: Glucose-Capillary: 99 mg/dL (ref 70–99)

## 2012-07-13 LAB — ACETAMINOPHEN LEVEL: Acetaminophen (Tylenol), Serum: <15 ug/mL (ref 10–30)

## 2012-07-13 LAB — CREATININE, SERUM: GFR calc Af Amer: >90 mL/min (ref >90)

## 2012-07-13 LAB — CK TOTAL AND CKMB (NOT AT ARMC)
Relative Index: 1 (ref 0.0–2.5)
Total CK: 299 U/L — ABNORMAL HIGH (ref 7–177)

## 2012-07-13 LAB — POCT I-STAT, CHEM 8
Hemoglobin: 16 g/dL — ABNORMAL HIGH (ref 12.0–15.0)
Sodium: 140 mEq/L (ref 135–145)
TCO2: 19 mmol/L (ref 0–100)

## 2012-07-13 LAB — PROTIME-INR: Prothrombin Time: 13.8 seconds (ref 11.6–15.2)

## 2012-07-13 LAB — TROPONIN I: Troponin I: <0.3 ng/mL (ref <0.30)

## 2012-07-13 LAB — ETHANOL: Alcohol, Ethyl (B): 128 mg/dL — ABNORMAL HIGH (ref 0–11)

## 2012-07-13 MED ORDER — IPRATROPIUM BROMIDE 0.02 % IN SOLN: 0.5000 mg | RESPIRATORY_TRACT | Status: DC | PRN

## 2012-07-13 MED ORDER — GUAIFENESIN ER 600 MG PO TB12
600.0000 mg | ORAL_TABLET | Freq: Two times a day (BID) | ORAL | Status: DC
  Administered 2012-07-13 – 2012-07-15 (×4): 600 mg via ORAL
  Filled 2012-07-13 (×6): qty 1

## 2012-07-13 MED ORDER — DEXTROSE 5 % IV SOLN
500.0000 mg | Freq: Once | INTRAVENOUS | Status: AC
  Administered 2012-07-13: 500 mg via INTRAVENOUS
  Filled 2012-07-13: qty 500

## 2012-07-13 MED ORDER — SODIUM CHLORIDE 0.9 % IV SOLN
500.0000 mg | Freq: Two times a day (BID) | INTRAVENOUS | Status: DC
  Administered 2012-07-13 – 2012-07-15 (×4): 500 mg via INTRAVENOUS
  Filled 2012-07-13 (×5): qty 5

## 2012-07-13 MED ORDER — FOLIC ACID 1 MG PO TABS
1.0000 mg | ORAL_TABLET | Freq: Every day | ORAL | Status: DC
  Administered 2012-07-14 – 2012-07-15 (×2): 1 mg via ORAL
  Filled 2012-07-13 (×3): qty 1

## 2012-07-13 MED ORDER — SODIUM CHLORIDE 0.9 % IV SOLN
INTRAVENOUS | Status: DC
  Administered 2012-07-13 – 2012-07-14 (×5): via INTRAVENOUS

## 2012-07-13 MED ORDER — LORAZEPAM 2 MG/ML IJ SOLN
2.0000 mg | Freq: Once | INTRAMUSCULAR | Status: AC
  Administered 2012-07-13: 2 mg via INTRAVENOUS
  Filled 2012-07-13: qty 1

## 2012-07-13 MED ORDER — AZITHROMYCIN 250 MG PO TABS: 250.0000 mg | ORAL_TABLET | Freq: Every day | ORAL | Status: DC

## 2012-07-13 MED ORDER — ATORVASTATIN CALCIUM 20 MG PO TABS
20.0000 mg | ORAL_TABLET | Freq: Every day | ORAL | Status: DC
  Administered 2012-07-14: 20 mg via ORAL
  Filled 2012-07-13 (×2): qty 1

## 2012-07-13 MED ORDER — LORAZEPAM 2 MG/ML IJ SOLN: 1.0000 mg | INTRAMUSCULAR | Status: DC | PRN

## 2012-07-13 MED ORDER — ENOXAPARIN SODIUM 40 MG/0.4ML ~~LOC~~ SOLN
40.0000 mg | SUBCUTANEOUS | Status: DC
  Administered 2012-07-13 – 2012-07-14 (×2): 40 mg via SUBCUTANEOUS
  Filled 2012-07-13 (×4): qty 0.4

## 2012-07-13 MED ORDER — AZITHROMYCIN 500 MG PO TABS
500.0000 mg | ORAL_TABLET | Freq: Every day | ORAL | Status: DC
  Filled 2012-07-13: qty 1

## 2012-07-13 MED ORDER — ASPIRIN 325 MG PO TABS
325.0000 mg | ORAL_TABLET | Freq: Every day | ORAL | Status: DC
  Administered 2012-07-14 – 2012-07-15 (×2): 325 mg via ORAL
  Filled 2012-07-13 (×2): qty 1

## 2012-07-13 MED ORDER — SODIUM CHLORIDE 0.9 % IJ SOLN
3.0000 mL | Freq: Two times a day (BID) | INTRAMUSCULAR | Status: DC
  Administered 2012-07-14: 3 mL via INTRAVENOUS

## 2012-07-13 MED ORDER — VITAMIN B-1 100 MG PO TABS
100.0000 mg | ORAL_TABLET | Freq: Every day | ORAL | Status: DC
  Administered 2012-07-14 – 2012-07-15 (×2): 100 mg via ORAL
  Filled 2012-07-13 (×3): qty 1

## 2012-07-13 MED ORDER — DEXTROSE 5 % IV SOLN
250.0000 mg | INTRAVENOUS | Status: DC
  Administered 2012-07-14: 250 mg via INTRAVENOUS
  Filled 2012-07-13 (×2): qty 250

## 2012-07-13 MED ORDER — THIAMINE HCL 100 MG/ML IJ SOLN
100.0000 mg | Freq: Once | INTRAMUSCULAR | Status: AC
  Administered 2012-07-13: 100 mg via INTRAVENOUS
  Filled 2012-07-13: qty 2

## 2012-07-13 MED ORDER — ONDANSETRON HCL 4 MG/2ML IJ SOLN: 4.0000 mg | Freq: Three times a day (TID) | INTRAMUSCULAR | Status: DC | PRN

## 2012-07-13 MED ORDER — ALBUTEROL SULFATE (5 MG/ML) 0.5% IN NEBU
2.5000 mg | INHALATION_SOLUTION | RESPIRATORY_TRACT | Status: DC | PRN
Start: 2012-07-13 — End: 2012-07-15
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
## 2012-07-13 NOTE — Procedures (Signed)
History: 41 year old female was found that difficulty talking, as well as difficulty moving her left side this morning.  Sedation: None, the patient had received Ativan 2 mg  Technical Details: This was a 16 channel EEG study  Background: There is a posterior dominant rhythm of 11 Hz that attenuates with eye opening. There is intermixed beta and alpha frequencies, with excess beta activity. There was an increase in slow activity during drowsiness and stage II sleep was observed.   Hyperventilation: Not performed Photic stimulation: Physiologic driving is present  EEG Diagnosis: 1) Excess beta activity  Clinical Interpretation: This was a normal waking and sleep EEG with the exception of increased beta activity that is consistent with the benzodiazepines she received earlier. There was no seizure or seizure predisposition recorded on this study.   Ritta Slot, MD [facility] [phone]

## 2012-07-13 NOTE — ED Notes (Signed)
Admitting in to see patient. Pt used bedpan with assistance.

## 2012-07-13 NOTE — Progress Notes (Signed)
STAT EEG completed in ED.
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
## 2012-07-13 NOTE — Consult Note (Signed)
Reason for Consult: Altered mental status Referring Physician: Carleene Cooper  CC: Altered mental status, left-sided weakness  History is obtained from: Sister, mother  HPI: Tonjua Rossetti is an 41 y.o. female who is unable to give a full history herself. She was with someone last night, but it is unclear if she was normal at the time of going to bed or when her deficits started. At 5 AM, EMS was called and responded to her house where she told him that she did not want to go into the emergency room. However, it is unclear what deficit she had already at that time. I have tried to call her uncle, who may have been with her, but he is not answering his phone.  Her sister suspects that she was drinking, possibly all night. Her sister arrived this morning after being called that something was wrong. She found her in the state that she is currently in the She has not seen any abnormal movements, other than the fact that her left arm is not moving much.   ROS: Unable to obtain due to patient's aphasia  PMH: Legionella community-acquired pneumonia with sepsis in July of 2012 And cryptitis   Family History: Per sister, no family history of seizure or stroke  Social History: Per sister Tob: Positive  Recent heavy drinking Is not employed  Exam: Current vital signs: BP 139/93  Pulse 106  Temp 98.1 F (36.7 C) (Oral)  Resp 24  SpO2 96%  LMP 07/13/2012 Vital signs in last 24 hours: Temp:  [98.1 F (36.7 C)] 98.1 F (36.7 C) (09/04 0831) Pulse Rate:  [104-106] 106  (09/04 0915) Resp:  [22-24] 24  (09/04 0915) BP: (139-147)/(93-96) 139/93 mmHg (09/04 0915) SpO2:  [96 %-97 %] 96 % (09/04 0915)  General: In bed, tearful CV: Regular rate and rhythm Mental Status: Patient is awake, alert, regards examiner, follows commands with her right side, and tongue, but not with her left side. She does not have any coherent speech. Cranial Nerves: II: Does not blink to threat from either  direction, pupils equal round and reactive III,IV, VI: Patient does not cooperate fully with tracking, but does cross midline both directions when tracking my face, but does not fully look in either direction  V,VII: Face is symmetric VIII: hearing is intact to voice XII: tongue is midline Motor: Follows commands with good strength in right arm and leg. Her left arm is extended and internally rotated. There are no clonic movements. Sensory: Responds to noxious stimuli in all 4 extremities, withdrawal on right, grimacing on left Deep Tendon Reflexes: Unable to assess on left side due to increased tone, right side has reflexes at the biceps and patella Plantars: Toes are downgoing on right, equivocal on left Cerebellar: Does not cooperate Gait: Does not cooperate  Impression: 41 year old female with new episode of extensor posturing on the left and difficulty speaking. I was concerned that this could represent a new intracranial hemorrhage, however CT was negative. She was treated with 2mg  IV ativan, and had more movement afterwards in her left arm making seizure a distinct possibility. A stat EEG is being performed, but I suspect that this has ceased. An MRI of the brain to rule out acute infarct will need to be performed.   At this point, she cannot be TPA candidate due to not knowing when her time of onset was, but with EMS first called at 5 AM it is clearly least 5 hours ago.  Recommendations: 1) CT head (  negative) 2) give 2 mg IV Ativan and get EEG 3) MRI brain will need to be performed to rule out acute stroke 4) would give IV thiamine   Ritta Slot, MD [facility] [phone]

## 2012-07-13 NOTE — ED Notes (Signed)
NIH score should have been at 1330. Pt has not had a stroke. Done as initial workup, want to show improvement

## 2012-07-13 NOTE — ED Notes (Signed)
Patient transported to CT 

## 2012-07-13 NOTE — ED Provider Notes (Signed)
History     CSN: [id number]  Arrival date & time 07/13/12  0822   First MD Initiated Contact with Patient 07/13/12 [phone]      Chief Complaint  Patient presents with  . stroke like symptoms     (Consider location/radiation/quality/duration/timing/severity/associated sxs/prior treatment) HPI Comments: Pt is a 41 year old lady who is awake, talks with a squeaky voice.  Apparently she had been seen by EMS early this AM, refused transport.  EMS called out again, found pt with left sided weakness, unable to speak.  Therefore transported to ED. Her mother is here, and says that pt has been drinking heavily since last Thursday, 6 days ago.    Patient is a 41 y.o. female presenting with neurologic complaint. The history is provided by a relative and medical records. No language interpreter was used.  Neurologic Problem The primary symptoms include altered mental status and focal weakness. Episode onset: Time of onset of pt's symptoms is unknown.   The neurological symptoms are focal. Context: After reported binge drinking of alcohol.   Medical issues also include alcohol use.    Past Medical History  Diagnosis Date  . Stroke     History reviewed. No pertinent past surgical history.  No family history on file.  History  Substance Use Topics  . Smoking status: Unknown If Ever Smoked  . Smokeless tobacco: Not on file  . Alcohol Use: Yes    OB History    Grav Para Term Preterm Abortions TAB SAB Ect Mult Living                  Review of Systems  Unable to perform ROS: Mental status change  Neurological: Positive for focal weakness.  Psychiatric/Behavioral: Positive for altered mental status.    Allergies  Review of patient's allergies indicates not on file.  Home Medications  No current outpatient prescriptions on file.  BP 147/96  Pulse 104  Temp 98.1 F (36.7 C) (Oral)  Resp 22  SpO2 97%  Physical Exam  Nursing note and vitals reviewed. Constitutional: She appears  well-developed and well-nourished.       Pt lies on stretcher, is awake, talks unintelligibly in a squeaky voice.  HENT:  Head: Normocephalic and atraumatic.  Right Ear: External ear normal.  Left Ear: External ear normal.  Mouth/Throat: Oropharynx is clear and moist.  Eyes: Conjunctivae and EOM are normal. Pupils are equal, round, and reactive to light.  Neck: Normal range of motion. Neck supple.       No carotid bruit.   Cardiovascular: Normal rate, regular rhythm and normal heart sounds.   Pulmonary/Chest: Effort normal and breath sounds normal.  Abdominal: Soft. Bowel sounds are normal.  Musculoskeletal: She exhibits no edema and no tenderness.  Neurological:       Pt is awake, speaks with squeaky voice, but words unintelligible.  Has left sided paralysis.   Skin: Skin is warm and dry.  Psychiatric:       Unable to assess.     ED Course  Procedures (including critical care time)  8:39 AM  Date: 07/13/2012  Rate: 114  Rhythm: sinus tachycardia  QRS Axis: normal  Intervals: normal QRS:  Left ventricular hypertrophy  ST/T Wave abnormalities: normal  Conduction Disutrbances:none  Narrative Interpretation: Abnormal EKG  Old EKG Reviewed: changes noted--Rate more rpid than on 05/03/2011.   8:58 AM Pt seen --> physical exam performed.  Lab workup ordered.  Neurology consult requested.    12:06 PM Results  for orders placed during the hospital encounter of 07/13/12  PROTIME-INR      Component Value Range   Prothrombin Time 13.8  11.6 - 15.2 seconds   INR 1.04  0.00 - 1.49  APTT      Component Value Range   aPTT 31  24 - 37 seconds  CBC      Component Value Range   WBC 11.0 (*) 4.0 - 10.5 K/uL   RBC 4.31  3.87 - 5.11 MIL/uL   Hemoglobin 14.8  12.0 - 15.0 g/dL   HCT 14.7  82.9 - 56.2 %   MCV 97.0  78.0 - 100.0 fL   MCH 34.3 (*) 26.0 - 34.0 pg   MCHC 35.4  30.0 - 36.0 g/dL   RDW 13.0  86.5 - 78.4 %   Platelets 262  150 - 400 K/uL  DIFFERENTIAL      Component Value  Range   Neutrophils Relative 69  43 - 77 %   Neutro Abs 7.6  1.7 - 7.7 K/uL   Lymphocytes Relative 25  12 - 46 %   Lymphs Abs 2.8  0.7 - 4.0 K/uL   Monocytes Relative 5  3 - 12 %   Monocytes Absolute 0.6  0.1 - 1.0 K/uL   Eosinophils Relative 0  0 - 5 %   Eosinophils Absolute 0.0  0.0 - 0.7 K/uL   Basophils Relative 0  0 - 1 %   Basophils Absolute 0.0  0.0 - 0.1 K/uL  COMPREHENSIVE METABOLIC PANEL      Component Value Range   Sodium 138  135 - 145 mEq/L   Potassium 3.4 (*) 3.5 - 5.1 mEq/L   Chloride 100  96 - 112 mEq/L   CO2 20  19 - 32 mEq/L   Glucose, Bld 103 (*) 70 - 99 mg/dL   BUN 4 (*) 6 - 23 mg/dL   Creatinine, Ser 6.96  0.50 - 1.10 mg/dL   Calcium 9.6  8.4 - 29.5 mg/dL   Total Protein 7.8  6.0 - 8.3 g/dL   Albumin 4.3  3.5 - 5.2 g/dL   AST 46 (*) 0 - 37 U/L   ALT 39 (*) 0 - 35 U/L   Alkaline Phosphatase 82  39 - 117 U/L   Total Bilirubin 0.3  0.3 - 1.2 mg/dL   GFR calc non Af Amer >90  >90 mL/min   GFR calc Af Amer >90  >90 mL/min  CK TOTAL AND CKMB      Component Value Range   Total CK 299 (*) 7 - 177 U/L   CK, MB 2.9  0.3 - 4.0 ng/mL   Relative Index 1.0  0.0 - 2.5  TROPONIN I      Component Value Range   Troponin I <0.30  <0.30 ng/mL  URINE RAPID DRUG SCREEN (HOSP PERFORMED)      Component Value Range   Opiates NONE DETECTED  NONE DETECTED   Cocaine POSITIVE (*) NONE DETECTED   Benzodiazepines NONE DETECTED  NONE DETECTED   Amphetamines NONE DETECTED  NONE DETECTED   Tetrahydrocannabinol POSITIVE (*) NONE DETECTED   Barbiturates NONE DETECTED  NONE DETECTED  ETHANOL      Component Value Range   Alcohol, Ethyl (B) 128 (*) 0 - 11 mg/dL  ACETAMINOPHEN LEVEL      Component Value Range   Acetaminophen (Tylenol), Serum <15.0  10 - 30 ug/mL  SALICYLATE LEVEL      Component Value Range  Salicylate Lvl <2.0 (*) 2.8 - 20.0 mg/dL  GLUCOSE, CAPILLARY      Component Value Range   Glucose-Capillary 99  70 - 99 mg/dL  POCT I-STAT, CHEM 8      Component Value  Range   Sodium 140  135 - 145 mEq/L   Potassium 3.5  3.5 - 5.1 mEq/L   Chloride 105  96 - 112 mEq/L   BUN <3 (*) 6 - 23 mg/dL   Creatinine, Ser 1.61  0.50 - 1.10 mg/dL   Glucose, Bld 096 (*) 70 - 99 mg/dL   Calcium, Ion 0.45  4.09 - 1.23 mmol/L   TCO2 19  0 - 100 mmol/L   Hemoglobin 16.0 (*) 12.0 - 15.0 g/dL   HCT 81.1 (*) 91.4 - 78.2 %   Ct Head Wo Contrast  07/13/2012  *RADIOLOGY REPORT*  Clinical Data: Altered mental status.  Left-sided weakness.  CT HEAD WITHOUT CONTRAST  Technique:  Contiguous axial images were obtained from the base of the skull through the vertex without contrast.  Comparison: None.  Findings: The brain has a normal appearance on all pulse sequences without evidence of malformation, atrophy, old or acute infarction, mass lesion, hemorrhage, hydrocephalus or extra-axial collection. Visualized sinuses, middle ears and mastoids are clear.  No calvarial abnormality.  IMPRESSION: Normal head CT   Original Report Authenticated By: Thomasenia Sales, M.D.    Dg Chest Port 1 View  07/13/2012  *RADIOLOGY REPORT*  Clinical Data: Altered mental status, noncommunicative  PORTABLE CHEST - 1 VIEW  Comparison: Portable chest x-ray of 06/10/2011  Findings: The lungs are not well aerated.  No focal infiltrate or effusion is seen.  The heart is within normal limits in size.  No bony abnormality is seen.  IMPRESSION: No active lung disease.   Original Report Authenticated By: Juline Patch, M.D.     EEG showed no seizure activity.    Neurology consultant thinks pt may have had a seizure.  He recommends MRI of brain, and admission by internal medicine.  12:32 PM Case discussed with [place], PA-C.  Admit to [facility], Team 10, Dr. Virginia Rochester, to a telemetry bed.  1. Altered mental status   2. Seizure   3. Alcohol intoxication   4. History of cocaine abuse   5. Encephalopathy   6. Spastic hemiplegia affecting unspecified side   7. Bronchitis   8. Stroke   9. Gait  abnormality   10. Dysarthria   11. Polysubstance abuse   12. Laryngitis           Carleene Cooper III, MD 07/15/12 [phone]

## 2012-07-13 NOTE — ED Notes (Signed)
Pt sister Leanor Kail) phone # [phone]; Pt Mother Lenard Forth) [phone]

## 2012-07-13 NOTE — ED Notes (Signed)
Pt more alert. Following more commands. Pt is relaxed and sleeping. No longer tearful and anxious.

## 2012-07-13 NOTE — ED Notes (Signed)
Regular diet tray ordered for patient with nectar thick liquids.

## 2012-07-13 NOTE — ED Notes (Signed)
Per EMS- unknown when LSN, EMS called out this morning. Pt was ambulatory, asking repetitive questions but refused transport. Pt lives alone. EMS called out again  Pt showing left side weakness, pt not speaking clearly, pt trying to talk but unable to articulate. Pt tearful. Weak on left side. Pupils equal and reactive. EKG ST 113, CBG 102, BP 142/100.

## 2012-07-13 NOTE — Progress Notes (Signed)
Pt is coughing and not able to handle her sputum and is using suction to clear it from her mouth, also her voice is weak. Pt is NPO. MD on call paged via [email or web address]. Awaiting call back.  Will cont to monitor. Deliah Goody 07/13/2012 8:46 PM

## 2012-07-14 DIAGNOSIS — G9340 Encephalopathy, unspecified: Principal | ICD-10-CM

## 2012-07-14 DIAGNOSIS — I6350 Cerebral infarction due to unspecified occlusion or stenosis of unspecified cerebral artery: Secondary | ICD-10-CM

## 2012-07-14 DIAGNOSIS — J40 Bronchitis, not specified as acute or chronic: Secondary | ICD-10-CM

## 2012-07-14 DIAGNOSIS — R569 Unspecified convulsions: Secondary | ICD-10-CM

## 2012-07-14 LAB — LIPID PANEL
Cholesterol: 137 mg/dL (ref 0–200)
LDL Cholesterol: 54 mg/dL (ref 0–99)
Triglycerides: 132 mg/dL (ref <150)

## 2012-07-14 LAB — CBC WITH DIFFERENTIAL/PLATELET
Basophils Relative: 1 % (ref 0–1)
Eosinophils Absolute: 0.1 10*3/uL (ref 0.0–0.7)
MCH: 32.8 pg (ref 26.0–34.0)
MCHC: 32.8 g/dL (ref 30.0–36.0)
Monocytes Relative: 7 % (ref 3–12)
Neutrophils Relative %: 47 % (ref 43–77)
Platelets: 212 10*3/uL (ref 150–400)

## 2012-07-14 MED ORDER — LORAZEPAM 1 MG PO TABS
1.0000 mg | ORAL_TABLET | Freq: Four times a day (QID) | ORAL | Status: DC | PRN
  Filled 2012-07-14: qty 1

## 2012-07-14 MED ORDER — BOOST / RESOURCE BREEZE PO LIQD
1.0000 | Freq: Three times a day (TID) | ORAL | Status: DC
  Administered 2012-07-14 – 2012-07-15 (×3): 1 via ORAL

## 2012-07-14 MED ORDER — LORAZEPAM 1 MG PO TABS: 0.0000 mg | ORAL_TABLET | Freq: Two times a day (BID) | ORAL | Status: DC

## 2012-07-14 MED ORDER — LORAZEPAM 1 MG PO TABS
0.0000 mg | ORAL_TABLET | Freq: Four times a day (QID) | ORAL | Status: DC
  Administered 2012-07-14: 1 mg via ORAL

## 2012-07-14 MED ORDER — LORAZEPAM 2 MG/ML IJ SOLN: 1.0000 mg | Freq: Four times a day (QID) | INTRAMUSCULAR | Status: DC | PRN

## 2012-07-14 MED ORDER — ADULT MULTIVITAMIN W/MINERALS CH
1.0000 | ORAL_TABLET | Freq: Every day | ORAL | Status: DC
  Administered 2012-07-14 – 2012-07-15 (×2): 1 via ORAL
  Filled 2012-07-14 (×2): qty 1
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
## 2012-07-14 NOTE — Progress Notes (Signed)
INITIAL ADULT NUTRITION ASSESSMENT Date: 07/14/2012   Time: 3:22 PM Reason for Assessment: MST  INTERVENTION: Resource Breeze po TID, each supplement provides 250 kcal and 9 grams of protein.   ASSESSMENT: Female 41 y.o.  Dx: Altered Mental Status and Left-sided weakness  Hx:  Past Medical History  Diagnosis Date  . Stroke    History reviewed. No pertinent past surgical history.  Related Meds:     . aspirin  325 mg Oral Daily  . atorvastatin  20 mg Oral q1800  . azithromycin  500 mg Intravenous Once   Followed by  . azithromycin  250 mg Intravenous Q24H  . enoxaparin (LOVENOX) injection  40 mg Subcutaneous Q24H  . folic acid  1 mg Oral Daily  . guaiFENesin  600 mg Oral BID  . levetiracetam  500 mg Intravenous Q12H  . LORazepam  0-4 mg Oral Q6H   Followed by  . LORazepam  0-4 mg Oral Q12H  . multivitamin with minerals  1 tablet Oral Daily  . sodium chloride  3 mL Intravenous Q12H  . thiamine  100 mg Oral Daily  . DISCONTD: azithromycin  250 mg Oral Daily  . DISCONTD: azithromycin  500 mg Oral Daily   Ht: 5\' 2"  (157.5 cm)  Wt: 153 lb 3.5 oz (69.5 kg)  Ideal Wt: 50 kg % Ideal Wt: 139%  Usual Wt:  Wt Readings from Last 10 Encounters:  07/13/12 153 lb 3.5 oz (69.5 kg)   % Usual Wt:  -  Body mass index is 28.02 kg/(m^2). Overweight  Food/Nutrition Related Hx:  Pt reports a decreased appetite  Labs:  CMP     Component Value Date/Time   NA 140 07/13/2012 0923   K 3.5 07/13/2012 0923   CL 105 07/13/2012 0923   CO2 20 07/13/2012 0858   GLUCOSE 104* 07/13/2012 0923   BUN <3* 07/13/2012 0923   CREATININE 0.81 07/13/2012 1846   CALCIUM 9.6 07/13/2012 0858   PROT 7.8 07/13/2012 0858   ALBUMIN 4.3 07/13/2012 0858   AST 46* 07/13/2012 0858   ALT 39* 07/13/2012 0858   ALKPHOS 82 07/13/2012 0858   BILITOT 0.3 07/13/2012 0858   GFRNONAA 88* 07/13/2012 1846   GFRAA >90 07/13/2012 1846   CBG (last 3)   Basename 07/13/12 0920  GLUCAP 99   No results found for this basename: HGBA1C    Lipid Panel     Component Value Date/Time   CHOL 137 07/14/2012 0701   TRIG 132 07/14/2012 0701   HDL 57 07/14/2012 0701   CHOLHDL 2.4 07/14/2012 0701   VLDL 26 07/14/2012 0701   LDLCALC 54 07/14/2012 0701    Intake/Output Summary (Last 24 hours) at 07/14/12 1524 Last data filed at 07/14/12 1009  Gross per 24 hour  Intake    120 ml  Output      0 ml  Net    120 ml   Last BM: 07/14/12  Diet Order: Regular Meal completion 10% at Breakfast  Supplements/Tube Feeding: none  IVF:    sodium chloride Last Rate: 160 mL/hr at 07/14/12 1146   Pt admitted with weakness, pt continues to have significant left-sided weakness and difficulty speaking. Hx of some alcohol use PTA also positive drug screen on admission. Pt in room with lights off, did not want to get up to talk but did answer a few questions. She reports no recent wt loss PTA but has had a poor appetite for 4 days PTA. Breakfast was pt's first solid  food meal and she only consumed bites. Pt does not like ensure but is willing to try [employer].   Estimated Nutritional Needs:   Kcal:  1500-1700 Protein:  75-90 grams Fluid:  >1.5 L/day  NUTRITION DIAGNOSIS: -Inadequate oral intake (NI-2.1).  Status: Ongoing  RELATED TO: decreased appetite  AS EVIDENCE BY: meal completion <25%  MONITORING/EVALUATION(Goals): Goal: Pt to meet >/= 90% of their estimated nutrition needs. Monitor: PO intake, weight  EDUCATION NEEDS: -No education needs identified at this time  DOCUMENTATION CODES Per approved criteria  -Not Applicable    Kendell Bane RD, LDN, CNSC [phone] Pager [phone] After Hours Pager  07/14/2012, 3:22 PM

## 2012-07-14 NOTE — Care Management Note (Signed)
    Page 1 of 1   07/14/2012     2:31:16 PM   CARE MANAGEMENT NOTE 07/14/2012  Patient:  [name], [name]   Account Number:  [id number]  Date Initiated:  07/14/2012  Documentation initiated by:  Onnie Boer  Subjective/Objective Assessment:   PT WAS ADMITTED WITH WEAKNESS     Action/Plan:   PROGRESSION OF CARE AND DISCHARGE PLANNING   Anticipated DC Date:  07/16/2012   Anticipated DC Plan:  HOME W HOME HEALTH SERVICES  In-house referral  Clinical Social Worker      DC Planning Services  CM consult      Choice offered to / List presented to:             Status of service:  In process, will continue to follow Medicare Important Message given?   (If response is "NO", the following Medicare IM given date fields will be blank) Date Medicare IM given:   Date Additional Medicare IM given:    Discharge Disposition:    Per UR Regulation:  Reviewed for med. necessity/level of care/duration of stay  If discussed at Long Length of Stay Meetings, dates discussed:    Comments:  07/14/12 Onnie Boer, RN, BSN 1430 PT WAS ADMITTED WITH WEAKNESS AND IS ON CIWA.  WILL F/U ON DC NEEDS AND RECOMMENDATIONS.

## 2012-07-15 ENCOUNTER — Inpatient Hospital Stay (HOSPITAL_COMMUNITY): Payer: Medicaid Other

## 2012-07-15 ENCOUNTER — Encounter (HOSPITAL_COMMUNITY): Payer: Self-pay | Admitting: Radiology

## 2012-07-15 DIAGNOSIS — R269 Unspecified abnormalities of gait and mobility: Secondary | ICD-10-CM

## 2012-07-15 MED ORDER — ASPIRIN 325 MG PO TABS: 325.0000 mg | ORAL_TABLET | Freq: Every day | ORAL | Status: AC

## 2012-07-15 MED ORDER — LEVETIRACETAM 500 MG PO TABS
500.0000 mg | ORAL_TABLET | Freq: Two times a day (BID) | ORAL | Status: DC
  Filled 2012-07-15 (×2): qty 1

## 2012-07-15 MED ORDER — ADULT MULTIVITAMIN W/MINERALS CH: 1.0000 | ORAL_TABLET | Freq: Every day | ORAL | Status: DC

## 2012-07-15 MED ORDER — LEVETIRACETAM 500 MG PO TABS: 500.0000 mg | ORAL_TABLET | Freq: Two times a day (BID) | ORAL | Status: DC

## 2012-07-15 MED ORDER — BOOST / RESOURCE BREEZE PO LIQD: 1.0000 | Freq: Three times a day (TID) | ORAL | Status: DC

## 2012-07-15 MED ORDER — THIAMINE HCL 100 MG PO TABS: 100.0000 mg | ORAL_TABLET | Freq: Every day | ORAL | Status: AC

## 2012-07-15 MED ORDER — AZITHROMYCIN 250 MG PO TABS: 250.0000 mg | ORAL_TABLET | Freq: Every day | ORAL | Status: AC

## 2012-07-15 MED ORDER — AZITHROMYCIN 250 MG PO TABS
250.0000 mg | ORAL_TABLET | Freq: Every day | ORAL | Status: DC
Start: 2012-07-15 — End: 2012-07-15
  Filled 2012-07-15: qty 1

## 2012-07-15 MED ORDER — FOLIC ACID 1 MG PO TABS: 1.0000 mg | ORAL_TABLET | Freq: Every day | ORAL | Status: AC

## 2012-07-15 NOTE — Progress Notes (Signed)
Patient discharged this with assessment remaining unchanged, educated on the need to take medication and follow up. Patient was also given a list of community care services where she can have an option to choose one for follow-up. Waiting on transportation from family .

## 2012-07-15 NOTE — Evaluation (Signed)
Physical Therapy Evaluation Patient Details Name: [name] MRN: [id number] DOB: 12-20-1970 Today's Date: 07/15/2012 Time: 4098-1191 PT Time Calculation (min): 25 min  PT Assessment / Plan / Recommendation Clinical Impression  [name] is 41 y/o female admitted with left sided weakness. Per MD questionable for CVA vs seizure. Presents to PT today with weakness, balance and gait deficits affecting her independence and safety at home. Rec HHPT and supervision for all OOB/mobility as well as that pt use RW when up. Pt has verbalized her need for assistance and use of RW.     PT Assessment  Patient needs continued PT services    Follow Up Recommendations  Home health PT;Supervision for mobility/OOB    Barriers to Discharge        Equipment Recommendations  None recommended by PT    Recommendations for Other Services     Frequency Min 4X/week    Precautions / Restrictions Precautions Precautions: Fall         Mobility  Bed Mobility Bed Mobility: Supine to Sit;Sit to Supine Supine to Sit: 6: Modified independent (Device/Increase time) Sit to Supine: 6: Modified independent (Device/Increase time) Transfers Transfers: Sit to Stand;Stand to Sit Sit to Stand: 4: Min assist;With upper extremity assist Stand to Sit: 5: Supervision;With upper extremity assist Details for Transfer Assistance: cues for hand placement especially with RW, initial stand without RW so pt pulling on therapist for stability Ambulation/Gait Ambulation/Gait Assistance: 3: Mod assist;4: Min guard Ambulation Distance (Feet): 100 Feet Assistive device: Rolling walker;1 person hand held assist Ambulation/Gait Assistance Details: initially pt ambulating to the bathroom with left arm around this therapists waist demonstrating lateral lean left needing modA for stability to ambulate 10 ft; with RUE HHA pt minA still with antalgic type quality to gait; with RW pt mingaurdA and able to ambulate 80 ft with  mingaurdA needing cues for upright posture and safe technique with RW Gait Pattern: Step-through pattern;Decreased hip/knee flexion - left;Decreased weight shift to left;Decreased stance time - left;Decreased step length - left General Gait Details: antalgic type gait although patient denies pain    Exercises General Exercises - Lower Extremity Long Arc Quad: AROM;Left;5 reps;Seated   PT Diagnosis: Difficulty walking;Abnormality of gait;Generalized weakness  PT Problem List: Decreased strength;Decreased activity tolerance;Decreased balance;Decreased mobility;Decreased knowledge of use of DME PT Treatment Interventions: DME instruction;Gait training;Functional mobility training;Stair training;Therapeutic activities;Therapeutic exercise;Balance training;Patient/family education;Neuromuscular re-education   PT Goals Acute Rehab PT Goals PT Goal Formulation: With patient Time For Goal Achievement: 07/22/12 Potential to Achieve Goals: Good Pt will go Sit to Stand: with modified independence PT Goal: Sit to Stand - Progress: Goal set today Pt will go Stand to Sit: with modified independence PT Goal: Stand to Sit - Progress: Goal set today Pt will Transfer Bed to Chair/Chair to Bed: with modified independence PT Transfer Goal: Bed to Chair/Chair to Bed - Progress: Goal set today Pt will Ambulate: >150 feet;with modified independence;with least restrictive assistive device PT Goal: Ambulate - Progress: Goal set today Pt will Go Up / Down Stairs: 1-2 stairs;with least restrictive assistive device;with min assist PT Goal: Up/Down Stairs - Progress: Goal set today Pt will Perform Home Exercise Program: Independently PT Goal: Perform Home Exercise Program - Progress: Goal set today  Visit Information  Last PT Received On: 07/15/12 Assistance Needed: +1    Subjective Data  Subjective: I don't know, I think I had a seizure or stroke.  Patient Stated Goal: I want to go home.    Prior  Functioning  Home Living Lives With: Significant other;Family (pt's uncle) Type of Home: House Home Access: Stairs to enter [employer] of Steps: 2 Entrance Stairs-Rails: Right;Can reach both;Left Home Layout: One level Bathroom Shower/Tub: Health visitor: Standard Home Adaptive Equipment: Bedside commode/3-in-1;Walker - rolling;Straight cane;Shower chair with back Prior Function Level of Independence: Independent with assistive device(s) (used cane) Able to Take Stairs?: Yes Driving: No Vocation: Unemployed Communication Communication: No difficulties    Cognition  Overall Cognitive Status: Appears within functional limits for tasks assessed/performed Arousal/Alertness: Lethargic Orientation Level: Disoriented to;Time Behavior During Session: Other (comment) (emotional but pleasant) Cognition - Other Comments: emotional about weakness in her LLE, lethargic initially as she had just woken up    Extremity/Trunk Assessment Right Upper Extremity Assessment RUE ROM/Strength/Tone: Within functional levels RUE Sensation: WFL - Light Touch Left Upper Extremity Assessment LUE ROM/Strength/Tone: Deficits LUE ROM/Strength/Tone Deficits: able to reach both arms over head with resisted shoulder flexion grossly 4+/5 on the left, unable to fully test biceps as pt c/o pain to the lateral portion of her left elbow; pt reporting this weakness is from "walking pneumonia" she had 8 months ago with resultant fluid in her leg, she does appear to have a larger girth to her knee with soft feel LUE Sensation: WFL - Light Touch LUE Coordination: Deficits LUE Coordination Deficits: slow but deliberate movement Right Lower Extremity Assessment RLE ROM/Strength/Tone: Within functional levels RLE Sensation: WFL - Light Touch Left Lower Extremity Assessment LLE ROM/Strength/Tone: Deficits LLE ROM/Strength/Tone Deficits: knee extension and hip flexion grossly 3-/5, PF/DF appear  4+/5, knee flexion not tested but functional for gait LLE Sensation: WFL - Light Touch   Balance    End of Session PT - End of Session Equipment Utilized During Treatment: Gait belt Activity Tolerance: Patient tolerated treatment well Patient left: in bed;with call bell/phone within reach;with bed alarm set Nurse Communication: Mobility status  GP     [facility] HELEN 07/15/2012, 2:03 PM

## 2012-07-15 NOTE — Discharge Summary (Signed)
[name] MRN: [id number] DOB/AGE: Oct 19, 1971 41 y.o.  Admit date: 07/13/2012 Discharge date: 07/15/2012  Primary Care Physician:  No primary provider on file.   Discharge Diagnoses:   Patient Active Problem List  Diagnosis  . Stroke  . Dysarthria  . Spastic hemiplegia affecting unspecified side  . Polysubstance abuse  . Encephalopathy  . Laryngitis  . Bronchitis    DISCHARGE MEDICATION: Medication List  As of 07/15/2012  5:56 PM   TAKE these medications         aspirin 325 MG tablet   Take 1 tablet (325 mg total) by mouth daily.      azithromycin 250 MG tablet   Commonly known as: ZITHROMAX   Take 1 tablet (250 mg total) by mouth daily.      feeding supplement Liqd   Take 1 Container by mouth 3 (three) times daily between meals.      folic acid 1 MG tablet   Commonly known as: FOLVITE   Take 1 tablet (1 mg total) by mouth daily.      levETIRAcetam 500 MG tablet   Commonly known as: KEPPRA   Take 1 tablet (500 mg total) by mouth every 12 (twelve) hours.      multivitamin with minerals Tabs   Take 1 tablet by mouth daily.      thiamine 100 MG tablet   Take 1 tablet (100 mg total) by mouth daily.              Consults: Treatment Team:  Kym Groom, MD   SIGNIFICANT DIAGNOSTIC STUDIES:  Ct Head Wo Contrast  07/15/2012  *RADIOLOGY REPORT*  Clinical Data: Nonverbal, question stroke, follow-up  CT HEAD WITHOUT CONTRAST  Technique:  Contiguous axial images were obtained from the base of the skull through the vertex without contrast.  Comparison: 07/13/2012  Findings: Normal ventricular morphology. No midline shift or mass effect. Normal appearance brain parenchyma. No intracranial hemorrhage, mass lesion or evidence of acute infarction. No interval change in appearance of brain parenchyma since previous exam. Visualized paranasal sinuses and mastoid air cells clear. Bones unremarkable.  IMPRESSION: No acute intracranial abnormalities. No interval change.    Original Report Authenticated By: Lollie Marrow, M.D.    Ct Head Wo Contrast  07/13/2012  *RADIOLOGY REPORT*  Clinical Data: Altered mental status.  Left-sided weakness.  CT HEAD WITHOUT CONTRAST  Technique:  Contiguous axial images were obtained from the base of the skull through the vertex without contrast.  Comparison: None.  Findings: The brain has a normal appearance on all pulse sequences without evidence of malformation, atrophy, old or acute infarction, mass lesion, hemorrhage, hydrocephalus or extra-axial collection. Visualized sinuses, middle ears and mastoids are clear.  No calvarial abnormality.  IMPRESSION: Normal head CT   Original Report Authenticated By: Thomasenia Sales, M.D.    Dg Chest Port 1 View  07/13/2012  *RADIOLOGY REPORT*  Clinical Data: Altered mental status, noncommunicative  PORTABLE CHEST - 1 VIEW  Comparison: Portable chest x-ray of 06/10/2011  Findings: The lungs are not well aerated.  No focal infiltrate or effusion is seen.  The heart is within normal limits in size.  No bony abnormality is seen.  IMPRESSION: No active lung disease.   Original Report Authenticated By: Juline Patch, M.D.         OTHER PROCEDURES:  EEG: This was a normal waking and sleep EEG  with the exception of increased beta activity that is consistent  with the benzodiazepines she received  earlier. There was no  seizure or seizure predisposition recorded on this study   Recent Results (from the past 240 hour(s))  CLOSTRIDIUM DIFFICILE BY PCR     Status: Normal   Collection Time   07/14/12 11:30 AM      Component Value Range Status Comment   C difficile by pcr NEGATIVE  NEGATIVE Final     BRIEF ADMITTING H & P: Please note no family members are present with the patient at this time. However as far as the patient can remember she was okay yesterday and awoke at some point this morning with altered mental status and weakness in her left side. A report to the ER when the patient arrived to the  emergency room she was unable to contribute anything to history of present illness and had definite paralysis on the left upper and lower extremity. The neurologist states that he gave her some Ativan and noted some improvement in the left upper extremity. At the time that I saw the patient she was verbal in the contribute information to her history of present illness. She was also able to move the left upper extremity partially has gravity and although her speech was raspy indicating some laryngitis she was able to answer questions appropriately and give her responses. The patient does state that she was having some coughing and symptoms of laryngitis prior to awakening with the above-stated symptoms on today. She denies any fever, chills, nausea, vomiting, diarrhea. The patient does admit to drinking alcohol in the last 24 hours and states that she drinks beer and "liquor" approximately 3 times week. The patient also admits to smoking marijuana but denies any cocaine use.    Hospital Course:  Present on Admission:  .Encephalopathy: Patient presented with an encephalopathy when she first arrived to the emergency room however this cleared very quickly. She also had a spastic hemiplegia of the left upper and lower extremity. She was seen initially by neurology who felt the patient was having seizures and recommended starting the patient on Keppra. The patient started on Keppra and had no discernible seizure activity during hospitalization. Neurology recommended the patient remain on Keppra for at least 6 months. At 24 hours after admission the patient still had persistent weakness on the left side. There was a question as to whether or not this could be a CVA. She had a repeat CT scan serially from the first one which does not show evidence of a CVA. On the subsequent day the patient had a complete resolution of her symptoms and reported that she felt she was back to normal. There was some question as to  whether or not they may be some psychogenic overlay to her symptoms. Nevertheless she was not treated as a CVA. She was evaluated by physical therapy who recommended the patient should have a walker for safety.   .Dysarthria/Laryngitis: The patient had laryngitis rather than dysarthria on admission. However by the time of admission this had cleared completely the patient's vocalizations are back to normal   .Polysubstance abuse: The patient was found to have a urine drug screen positive for cocaine, marijuana and alcohol. The patient denies cocaine use but does admit to alcohol and marijuana use she was counseled against polysubstance use and appears to be contemplative phase.    .Bronchitis: The patient presented in what appears to be an acute bronchitis. She was treated with nebulizers and supportive care and at the time of her discharge had no oxygen requirement in no increased  work of breathing.  .Gait abnormality: The patient was seen by physical therapy who recommended the patient should have a rolling walker and home health physical therapy.  Condition at the time of discharge: Good  Disposition and Follow-up:  Patient does not have primary care physician. Spoken with the case manager Onnie Boer who will assist the patient with a followup appointment post discharge. She is being discharged home with home health physical therapy. Discharge Orders    Future Orders Please Complete By Expires   Diet general      Increase activity slowly      Walker          DISCHARGE EXAM:  General: Alert, awake, oriented x3, in no acute distress. Vocalization still very raspy it difficult to hear \ Vital signs:Blood pressure 122/78, pulse 70, temperature 98 F (36.7 C), temperature source Oral, resp. rate 18, height 5\' 2"  (1.575 m), weight 69.5 kg (153 lb 3.5 oz), last menstrual period 07/13/2012, SpO2 100.00%. OROPHARYNX: Moist, No exudate/ erythema/lesions.  Heart: Regular rate and rhythm,  without murmurs, rubs, gallops.  Lungs: Clear to auscultation.  Abdomen: Soft, nontender, nondistended, positive bowel sounds, no masses no hepatosplenomegaly noted.  Neuro: Strength 4+/5 in left upper extremity and left lower extremity. DTRs 2+ bilaterally in the upper and lower extremity.  Musculoskeletal: No warm swelling or erythema around joints, no spinal tenderness noted.  Psychiatric: Patient alert and oriented x3, good insight and cognition, good recent to remote recall.      Basename 07/13/12 1846 07/13/12 0923 07/13/12 0858  NA -- 140 138  K -- 3.5 3.4*  CL -- 105 100  CO2 -- -- 20  GLUCOSE -- 104* 103*  BUN -- <3* 4*  CREATININE 0.81 1.10 --  CALCIUM -- -- 9.6  MG -- -- --  PHOS -- -- --    Basename 07/13/12 0858  AST 46*  ALT 39*  ALKPHOS 82  BILITOT 0.3  PROT 7.8  ALBUMIN 4.3   No results found for this basename: LIPASE:2,AMYLASE:2 in the last 72 hours  Basename 07/14/12 0701 07/13/12 1846 07/13/12 0858  WBC 6.6 8.9 --  NEUTROABS 3.1 -- 7.6  HGB 12.3 14.0 --  HCT 37.5 39.2 --  MCV 100.0 97.5 --  PLT 212 256 --   Total time for discharge including decision-making face-to-face time greater than 30 minutes  Signed: Kaelene Hunt A. 07/15/2012, 5:56 PM

## 2012-07-15 NOTE — Progress Notes (Signed)
Speech Language Pathology Dysphagia Treatment Patient Details Name: [name] MRN: [id number] DOB: 1971/05/22 Today's Date: 07/15/2012 Time: 0347-4259 SLP Time Calculation (min): 15 min  Assessment / Plan / Recommendation Clinical Impression  Purpose of diagnostic treatment for diet tolerance of regular consistency and thin liquids following initial BSE completed on 07/14/12.  No s/s of aspiration observed with PO trials of regular consistency/thin water by cup.  Patient demonstrated understanding of swallow precautions.  ST to sign off as patient has met all goals  and education completed.      Diet Recommendation  Continue with Current Diet: Regular;Thin liquid    SLP Plan All goals met;Discharge SLP treatment due to (comment)      Swallowing Goals  SLP Swallowing Goals Swallow Study Goal #1 - Progress: Met Swallow Study Goal #2 - Progress: Met  General Temperature Spikes Noted: No Respiratory Status: Room air Behavior/Cognition: Alert;Cooperative Oral Cavity - Dentition: Adequate natural dentition Patient Positioning: Upright in bed  Oral Cavity - Oral Hygiene Brush patient's teeth BID with toothbrush (using toothpaste with fluoride): Yes   Dysphagia Treatment Treatment focused on: Skilled observation of diet tolerance Treatment Methods/Modalities: Skilled observation Patient observed directly with PO's: Yes Type of PO's observed: Regular;Thin liquids Feeding: Able to feed self Liquids provided via: Cup   GO    Moreen Fowler MS, CCC-SLP [phone] [facility] 07/15/2012, 3:31 PM

## 2012-07-15 NOTE — Progress Notes (Signed)
No acute events overnight  Exam: Filed Vitals:   07/15/12 0618  BP: 139/91  Pulse: 82  Temp: 98.1 F (36.7 C)  Resp: 18   General: No apparent distress, boyfriend at bedside Mental status: Awake, alert Speech: Continues to be hoarse, but with prompting is able to improve Cranial nerves: Pupils equal round and reactive, extraocular movements intact Motor: Initially does not voluntarily move her left arm very much, however with prompting she is able to give good strength, at least 4+ out of 5 throughout her left arm and has a mild downward drift without pronation. Similarly in the leg, she is 4/5 strength, improves with repeated encouragement. Gait: With assistance unable to have the patient stand up, and then she is able to stand unsupported.  Impression: 42 year old female who presented with altered mental status in the setting of left arm extension and rotation. I continue to suspect that with the improvement following Ativan, seizure is a likely etiology of her initial presentation. Without being able to do an MRI, it is impossible to completely rule out a small stroke and therefore would continue her on antiplatelet therapy.   She continues to have mild deficits, and I suspect that her hoarseness is not due to nerve palsy, but may be vocal cord irritation as she did have emesis  Her improvement with encouragement is suggestive that there may be some psychogenic overlay, however with her initial presentation I would favor continuing antiepileptic therapy.  1) continue Keppra 500 twice a day 2) continue aspirin 3) PT consult

## 2012-07-15 NOTE — Progress Notes (Signed)
PHARMACIST - PHYSICIAN COMMUNICATION DR:   Ashley Royalty CONCERNING: Antibiotic IV to Oral Route Change Policy  RECOMMENDATION: This patient is receiving Zithromax by the intravenous route.  Based on criteria approved by the Pharmacy and Therapeutics Committee, the antibiotic(s) is/are being converted to the equivalent oral dose form(s).   DESCRIPTION: These criteria include:  Patient being treated for a respiratory tract infection, urinary tract infection, or cellulitis  The patient is not neutropenic and does not exhibit a GI malabsorption state  The patient is eating (either orally or via tube) and/or has been taking other orally administered medications for a least 24 hours  The patient is improving clinically and has a Tmax < 100.5  If you have questions about this conversion, please contact the Pharmacy Department  []   [phone] )  [facility] [x]   [phone] )  [facility]  []   [phone] )  [facility] []   [phone] )  [facility] S. Merilynn Finland, PharmD, BCPS Clinical Staff Pharmacist Pager [phone]

## 2015-07-09 ENCOUNTER — Emergency Department (INDEPENDENT_AMBULATORY_CARE_PROVIDER_SITE_OTHER): Payer: Medicaid Other

## 2015-07-09 ENCOUNTER — Emergency Department (INDEPENDENT_AMBULATORY_CARE_PROVIDER_SITE_OTHER)
Admission: EM | Admit: 2015-07-09 | Discharge: 2015-07-09 | Disposition: A | Discharge status: Home / Self Care | Payer: Medicaid Other | Source: Home / Self Care | Attending: Emergency Medicine | Admitting: Emergency Medicine

## 2015-07-09 ENCOUNTER — Encounter (HOSPITAL_COMMUNITY): Payer: Self-pay | Admitting: *Deleted

## 2015-07-09 DIAGNOSIS — M25562 Pain in left knee: Secondary | ICD-10-CM

## 2015-07-09 MED ORDER — KETOROLAC TROMETHAMINE 60 MG/2ML IM SOLN
60.0000 mg | Freq: Once | INTRAMUSCULAR | Status: AC
  Administered 2015-07-09: 60 mg via INTRAMUSCULAR

## 2015-07-09 MED ORDER — KETOROLAC TROMETHAMINE 60 MG/2ML IM SOLN
INTRAMUSCULAR | Status: AC
  Filled 2015-07-09: qty 2

## 2015-07-09 MED ORDER — DICLOFENAC SODIUM 75 MG PO TBEC: 75.0000 mg | DELAYED_RELEASE_TABLET | Freq: Two times a day (BID) | ORAL | Status: DC

## 2015-07-09 MED ORDER — TRAMADOL HCL 50 MG PO TABS: ORAL_TABLET | ORAL | Status: DC

## 2015-07-09 NOTE — ED Notes (Signed)
Pt  Reports   Low  Back  Pain      As   Well as  Pain l  Knee        For  A  While        She reports  A  History   Of  Back  Problems  In  Past           And  She  Reports     As   Well  That     She  Larey Seat      Last  Week     She  Is  Sitting  Upright  In  The  Wheelchair       Appears  In  Discomfort

## 2015-07-09 NOTE — ED Provider Notes (Signed)
HPI  SUBJECTIVE:  [name] is a 44 y.o. female who presents with left knee pain for several years. She reports having a slip and fall 2 weeks ago, reports worsening of her left knee pain that she describes as burning. She reports some swelling, tightness, states that the knee gives out secondary to weakness and pain, she denies erythema, numbness, tingling, fevers, popping, clicking. Symptoms are better with knee flexed on a pillow, worse with palpation, standing, bending her knee past 90, fully straightening her knee. She has been taking Aleve 2 tabs 3 times a day. Took 2 Aleve today last dose at noon. LMP 8/10, denies possibility of being pregnant. Past medical history negative for diabetes, gout. Patient is also complaining of mild bilateral back soreness that she thinks is from change in her gait. This is not her primary concern today. She has no urinary symptoms, no urinary or fecal incontinence, saddle anesthesia, distal numbness, tingling, leg weakness, abdominal pain.   Past Medical History  Diagnosis Date  . Stroke     History reviewed. No pertinent past surgical history.  History reviewed. No pertinent family history.  Social History  Substance Use Topics  . Smoking status: Unknown If Ever Smoked  . Smokeless tobacco: None  . Alcohol Use: Yes    No current facility-administered medications for this encounter.  Current outpatient prescriptions:  .  feeding supplement (RESOURCE BREEZE) LIQD, Take 1 Container by mouth 3 (three) times daily between meals., Disp: , Rfl:  .  levETIRAcetam (KEPPRA) 500 MG tablet, Take 1 tablet (500 mg total) by mouth every 12 (twelve) hours., Disp: 60 tablet, Rfl: 0 .  Multiple Vitamin (MULTIVITAMIN WITH MINERALS) TABS, Take 1 tablet by mouth daily., Disp: , Rfl:   No Known Allergies   ROS  As noted in HPI.   Physical Exam  BP 132/90 mmHg  Pulse 68  Temp(Src) 99.4 F (37.4 C) (Oral)  Resp 16  SpO2 98%  LMP  06/19/2015  Constitutional: Well developed, well nourished, appears uncomfortable Eyes:  EOMI, conjunctiva normal bilaterally HENT: Normocephalic, atraumatic,mucus membranes moist Respiratory: Normal inspiratory effort Cardiovascular: Normal rate GI: nondistended. No suprapubic tenderness skin: No rash, skin intact Musculoskeletal: no CVAT. + Mild bilateral paralumbar tenderness,  - muscle spasm. No bony tenderness. No pain with PROM hips. SLR neg.  L knee: Tenderness over left patella, limited range of motion due to patient discomfort. Minimal effusion. No patellar crepitus. Diffuse joint tenderness. Pain with varus more than valgus stress, negative McMurray, negative Lachman, patient able to flex to 90, but pain with fully extending knee. No erythema, no increased temperature. Patient neurovascularly intact distally. Neurologic: Alert & oriented x 3, no focal neuro deficits Psychiatric: Speech and behavior appropriate  ED Course   Medications  ketorolac (TORADOL) injection 60 mg (60 mg Intramuscular Given 07/09/15 1927)    Orders Placed This Encounter  Procedures  . DG Knee Complete 4 Views Left    Standing Status: Standing     Number of Occurrences: 1     Standing Expiration Date:     Order Specific Question:  Reason for Exam (SYMPTOM  OR DIAGNOSIS REQUIRED)    Answer:  patellar pain, diffuse joint pain r/o fx, effusion  . Apply ace wrap    Standing Status: Standing     Number of Occurrences: 1     Standing Expiration Date:   . Crutches    Standing Status: Standing     Number of Occurrences: 1     Standing  Expiration Date:     No results found for this or any previous visit (from the past 24 hour(s)). Dg Knee Complete 4 Views Left  07/09/2015   CLINICAL DATA:  Fall, left knee pain  EXAM: LEFT KNEE - COMPLETE 4+ VIEW  COMPARISON:  None.  FINDINGS: There is no evidence of fracture, dislocation, or joint effusion. There is no evidence of arthropathy or other focal bone  abnormality. Soft tissues are unremarkable.  IMPRESSION: Negative.   Electronically Signed   By: Christiana Pellant M.D.   On: 07/09/2015 19:51    ED Clinical Impression  No diagnosis found.  ED Assessment/Plan  [place] narcotic database reviewed. Pt with no narcotic rx in the past 6 months.  Given patellar tenderness we'll check knee x-ray to rule out fracture. No evidence of septic joint, gout. No evidence of patellar tendon rupture, quadriceps rupture.  Reviewed imaging independently.  imaging normal. See radiology report for full details.  No acute changes on x-ray. No effusion, fracture, dislocation. No evidence that patient has done anything acutely to her knee, however, unsure of etiology of her pain that has been present for several years. She is to discontinue the Aleve, start  diclofenac, tramadol, ice, elevation, Ace wrap, crutches/cane. Follow-up with orthopedics if no better in a week to 10 days. Discussed  imaging, MDM, plan and followup with patient. Discussed sn/sx that should prompt return to the UC or ED. Patient agrees with plan.   *This clinic note was created using Dragon dictation software. Therefore, there may be occasional mistakes despite careful proofreading.  ?    Domenick Gong, MD 07/09/15 2019

## 2015-07-09 NOTE — Discharge Instructions (Signed)
Stop the Aleve. Start taking the diclofenac instead. Make sure you take it with food. Tramadol for severe pain only. Ice, elevation, crutches and/or use a cane. Your back pain will get better once you start walking normally again.

## 2021-02-18 ENCOUNTER — Other Ambulatory Visit: Payer: Self-pay

## 2021-02-18 ENCOUNTER — Emergency Department (HOSPITAL_COMMUNITY)
Admission: EM | Admit: 2021-02-18 | Discharge: 2021-02-18 | Disposition: A | Discharge status: Home / Self Care | Payer: Medicaid Other | Attending: Emergency Medicine | Admitting: Emergency Medicine

## 2021-02-18 ENCOUNTER — Encounter (HOSPITAL_COMMUNITY): Payer: Self-pay | Admitting: Emergency Medicine

## 2021-02-18 ENCOUNTER — Emergency Department (HOSPITAL_COMMUNITY): Payer: Medicaid Other

## 2021-02-18 DIAGNOSIS — F10920 Alcohol use, unspecified with intoxication, uncomplicated: Secondary | ICD-10-CM | POA: Diagnosis not present

## 2021-02-18 DIAGNOSIS — Y9301 Activity, walking, marching and hiking: Secondary | ICD-10-CM | POA: Diagnosis not present

## 2021-02-18 DIAGNOSIS — S8991XA Unspecified injury of right lower leg, initial encounter: Secondary | ICD-10-CM | POA: Diagnosis present

## 2021-02-18 DIAGNOSIS — S80211A Abrasion, right knee, initial encounter: Secondary | ICD-10-CM | POA: Diagnosis not present

## 2021-02-18 DIAGNOSIS — Y92512 Supermarket, store or market as the place of occurrence of the external cause: Secondary | ICD-10-CM | POA: Insufficient documentation

## 2021-02-18 DIAGNOSIS — S4991XA Unspecified injury of right shoulder and upper arm, initial encounter: Secondary | ICD-10-CM | POA: Diagnosis not present

## 2021-02-18 LAB — ETHANOL: Alcohol, Ethyl (B): 212 mg/dL — ABNORMAL HIGH (ref <10)

## 2021-02-18 MED ORDER — NAPROXEN 500 MG PO TABS
500.0000 mg | ORAL_TABLET | Freq: Once | ORAL | Status: AC
Start: 2021-02-18 — End: 2021-02-18
  Administered 2021-02-18: 500 mg via ORAL
  Filled 2021-02-18: qty 1

## 2021-02-18 NOTE — ED Notes (Signed)
Pt ambulatory to bathroom with 1 assist. Pt able to bear weight on right knee and move right shoulder

## 2021-02-18 NOTE — ED Provider Notes (Signed)
MSE was initiated and I personally evaluated the patient and placed orders (if any) at  12:23 AM on February 18, 2021.  The patient appears stable so that the remainder of the MSE may be completed by another provider.  Abrasion right knee; no bony deformity.  Patient appears intoxicated.    Deeanne Deininger, Jonny Ruiz, MD 02/18/21 [phone]

## 2021-02-18 NOTE — ED Notes (Signed)
Pt is waiting for rider per last shift RN. Night charge approved pt to stay in room until ride arrives.

## 2021-02-18 NOTE — ED Notes (Signed)
Pt refused blood work. Pt stated " I don't do needles". This writer explained that blood draw would be quick. Pt said "I refuse. Just give me a pill."
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
## 2021-02-18 NOTE — ED Notes (Signed)
Writer attempted to call sister and got no answer. Pt was d/c to lobby to wait for ride.

## 2021-02-18 NOTE — ED Notes (Signed)
Spoke with Lupita Leash, patient sister, with permission. She stated she would have her son pick up the patient after 7.

## 2021-02-18 NOTE — ED Notes (Signed)
Pt in bed resting, respirations even and unlabored.
